# Patient Record
Sex: Female | Born: 1970 | Race: Black or African American | Hispanic: No | Marital: Married | Smoking: Never smoker
Health system: Southern US, Community
[De-identification: ages and names within clinical notes are randomized; demographics above are authoritative.]

## PROBLEM LIST (undated history)

## (undated) DIAGNOSIS — N390 Urinary tract infection, site not specified: Secondary | ICD-10-CM

## (undated) DIAGNOSIS — N870 Mild cervical dysplasia: Secondary | ICD-10-CM

## (undated) DIAGNOSIS — G43909 Migraine, unspecified, not intractable, without status migrainosus: Secondary | ICD-10-CM

## (undated) DIAGNOSIS — T7840XA Allergy, unspecified, initial encounter: Secondary | ICD-10-CM

## (undated) DIAGNOSIS — B977 Papillomavirus as the cause of diseases classified elsewhere: Secondary | ICD-10-CM

## (undated) DIAGNOSIS — R896 Abnormal cytological findings in specimens from other organs, systems and tissues: Secondary | ICD-10-CM

## (undated) DIAGNOSIS — Z8669 Personal history of other diseases of the nervous system and sense organs: Secondary | ICD-10-CM

## (undated) DIAGNOSIS — A749 Chlamydial infection, unspecified: Secondary | ICD-10-CM

## (undated) DIAGNOSIS — B019 Varicella without complication: Secondary | ICD-10-CM

## (undated) DIAGNOSIS — G4700 Insomnia, unspecified: Secondary | ICD-10-CM

## (undated) DIAGNOSIS — IMO0002 Reserved for concepts with insufficient information to code with codable children: Secondary | ICD-10-CM

## (undated) HISTORY — DX: Personal history of other diseases of the nervous system and sense organs: Z86.69

## (undated) HISTORY — DX: Chlamydial infection, unspecified: A74.9

## (undated) HISTORY — DX: Papillomavirus as the cause of diseases classified elsewhere: B97.7

## (undated) HISTORY — DX: Allergy, unspecified, initial encounter: T78.40XA

## (undated) HISTORY — DX: Urinary tract infection, site not specified: N39.0

## (undated) HISTORY — DX: Varicella without complication: B01.9

## (undated) HISTORY — DX: Mild cervical dysplasia: N87.0

## (undated) HISTORY — DX: Reserved for concepts with insufficient information to code with codable children: IMO0002

## (undated) HISTORY — DX: Abnormal cytological findings in specimens from other organs, systems and tissues: R89.6

## (undated) HISTORY — DX: Insomnia, unspecified: G47.00

## (undated) HISTORY — DX: Migraine, unspecified, not intractable, without status migrainosus: G43.909

---

## 1998-02-20 DIAGNOSIS — A749 Chlamydial infection, unspecified: Secondary | ICD-10-CM

## 1998-02-20 HISTORY — DX: Chlamydial infection, unspecified: A74.9

## 1998-09-23 ENCOUNTER — Inpatient Hospital Stay (HOSPITAL_COMMUNITY): Admission: AD | Admit: 1998-09-23 | Discharge: 1998-09-29 | Payer: Self-pay | Admitting: Obstetrics and Gynecology

## 1998-09-25 ENCOUNTER — Encounter: Payer: Self-pay | Admitting: Obstetrics & Gynecology

## 1998-09-27 ENCOUNTER — Encounter: Payer: Self-pay | Admitting: Obstetrics & Gynecology

## 1998-09-29 ENCOUNTER — Encounter: Payer: Self-pay | Admitting: Obstetrics & Gynecology

## 1998-10-06 ENCOUNTER — Inpatient Hospital Stay (HOSPITAL_COMMUNITY): Admission: AD | Admit: 1998-10-06 | Discharge: 1998-10-09 | Payer: Self-pay | Admitting: Obstetrics and Gynecology

## 1998-10-06 ENCOUNTER — Encounter (INDEPENDENT_AMBULATORY_CARE_PROVIDER_SITE_OTHER): Payer: Self-pay | Admitting: Specialist

## 1998-10-22 DIAGNOSIS — R87619 Unspecified abnormal cytological findings in specimens from cervix uteri: Secondary | ICD-10-CM

## 1998-10-22 DIAGNOSIS — IMO0002 Reserved for concepts with insufficient information to code with codable children: Secondary | ICD-10-CM

## 1998-10-22 HISTORY — DX: Unspecified abnormal cytological findings in specimens from cervix uteri: R87.619

## 1998-10-22 HISTORY — DX: Reserved for concepts with insufficient information to code with codable children: IMO0002

## 1998-11-08 ENCOUNTER — Other Ambulatory Visit: Admission: RE | Admit: 1998-11-08 | Discharge: 1998-11-08 | Payer: Self-pay | Admitting: Obstetrics and Gynecology

## 1998-12-14 ENCOUNTER — Encounter (INDEPENDENT_AMBULATORY_CARE_PROVIDER_SITE_OTHER): Payer: Self-pay

## 1998-12-14 ENCOUNTER — Other Ambulatory Visit: Admission: RE | Admit: 1998-12-14 | Discharge: 1998-12-14 | Payer: Self-pay | Admitting: Obstetrics and Gynecology

## 1999-02-21 DIAGNOSIS — B977 Papillomavirus as the cause of diseases classified elsewhere: Secondary | ICD-10-CM

## 1999-02-21 HISTORY — DX: Papillomavirus as the cause of diseases classified elsewhere: B97.7

## 1999-12-12 ENCOUNTER — Other Ambulatory Visit: Admission: RE | Admit: 1999-12-12 | Discharge: 1999-12-12 | Payer: Self-pay | Admitting: Obstetrics and Gynecology

## 2000-12-21 ENCOUNTER — Other Ambulatory Visit: Admission: RE | Admit: 2000-12-21 | Discharge: 2000-12-21 | Payer: Self-pay | Admitting: Obstetrics and Gynecology

## 2001-12-19 ENCOUNTER — Other Ambulatory Visit: Admission: RE | Admit: 2001-12-19 | Discharge: 2001-12-19 | Payer: Self-pay | Admitting: Obstetrics and Gynecology

## 2002-06-27 ENCOUNTER — Other Ambulatory Visit: Admission: RE | Admit: 2002-06-27 | Discharge: 2002-06-27 | Payer: Self-pay | Admitting: Obstetrics and Gynecology

## 2002-12-26 ENCOUNTER — Other Ambulatory Visit: Admission: RE | Admit: 2002-12-26 | Discharge: 2002-12-26 | Payer: Self-pay | Admitting: Obstetrics and Gynecology

## 2003-06-05 ENCOUNTER — Other Ambulatory Visit: Admission: RE | Admit: 2003-06-05 | Discharge: 2003-06-05 | Payer: Self-pay | Admitting: Obstetrics and Gynecology

## 2004-01-07 ENCOUNTER — Other Ambulatory Visit: Admission: RE | Admit: 2004-01-07 | Discharge: 2004-01-07 | Payer: Self-pay | Admitting: Obstetrics and Gynecology

## 2004-02-21 DIAGNOSIS — IMO0001 Reserved for inherently not codable concepts without codable children: Secondary | ICD-10-CM

## 2004-02-21 HISTORY — DX: Reserved for inherently not codable concepts without codable children: IMO0001

## 2004-07-13 ENCOUNTER — Other Ambulatory Visit: Admission: RE | Admit: 2004-07-13 | Discharge: 2004-07-13 | Payer: Self-pay | Admitting: Obstetrics and Gynecology

## 2004-11-30 ENCOUNTER — Other Ambulatory Visit: Admission: RE | Admit: 2004-11-30 | Discharge: 2004-11-30 | Payer: Self-pay | Admitting: Obstetrics and Gynecology

## 2005-02-20 DIAGNOSIS — G4700 Insomnia, unspecified: Secondary | ICD-10-CM

## 2005-02-20 HISTORY — DX: Insomnia, unspecified: G47.00

## 2005-04-20 ENCOUNTER — Other Ambulatory Visit: Admission: RE | Admit: 2005-04-20 | Discharge: 2005-04-20 | Payer: Self-pay | Admitting: Obstetrics and Gynecology

## 2005-10-30 ENCOUNTER — Other Ambulatory Visit: Admission: RE | Admit: 2005-10-30 | Discharge: 2005-10-30 | Payer: Self-pay | Admitting: Obstetrics and Gynecology

## 2006-12-25 ENCOUNTER — Ambulatory Visit (HOSPITAL_COMMUNITY): Admission: RE | Admit: 2006-12-25 | Discharge: 2006-12-25 | Payer: Self-pay | Admitting: Internal Medicine

## 2009-02-20 DIAGNOSIS — N870 Mild cervical dysplasia: Secondary | ICD-10-CM

## 2009-02-20 HISTORY — DX: Mild cervical dysplasia: N87.0

## 2009-04-08 ENCOUNTER — Ambulatory Visit (HOSPITAL_COMMUNITY): Admission: RE | Admit: 2009-04-08 | Discharge: 2009-04-08 | Payer: Self-pay | Admitting: Internal Medicine

## 2009-07-22 ENCOUNTER — Ambulatory Visit (HOSPITAL_COMMUNITY): Admission: RE | Admit: 2009-07-22 | Discharge: 2009-07-22 | Payer: Self-pay | Admitting: Internal Medicine

## 2009-08-19 ENCOUNTER — Encounter: Admission: RE | Admit: 2009-08-19 | Discharge: 2009-08-19 | Payer: Self-pay | Admitting: Obstetrics and Gynecology

## 2010-07-14 ENCOUNTER — Other Ambulatory Visit: Payer: Self-pay | Admitting: Obstetrics and Gynecology

## 2010-07-14 DIAGNOSIS — Z1231 Encounter for screening mammogram for malignant neoplasm of breast: Secondary | ICD-10-CM

## 2010-08-23 ENCOUNTER — Ambulatory Visit
Admission: RE | Admit: 2010-08-23 | Discharge: 2010-08-23 | Disposition: A | Discharge status: Home / Self Care | Payer: BC Managed Care – PPO | Source: Ambulatory Visit | Attending: Obstetrics and Gynecology | Admitting: Obstetrics and Gynecology

## 2010-08-23 DIAGNOSIS — Z1231 Encounter for screening mammogram for malignant neoplasm of breast: Secondary | ICD-10-CM

## 2011-02-16 ENCOUNTER — Encounter: Payer: Self-pay | Admitting: Family Medicine

## 2011-02-16 ENCOUNTER — Ambulatory Visit (INDEPENDENT_AMBULATORY_CARE_PROVIDER_SITE_OTHER): Payer: BC Managed Care – PPO | Admitting: Family Medicine

## 2011-02-16 DIAGNOSIS — G589 Mononeuropathy, unspecified: Secondary | ICD-10-CM | POA: Insufficient documentation

## 2011-02-16 DIAGNOSIS — M545 Low back pain, unspecified: Secondary | ICD-10-CM

## 2011-02-16 DIAGNOSIS — G4700 Insomnia, unspecified: Secondary | ICD-10-CM | POA: Insufficient documentation

## 2011-02-16 MED ORDER — TEMAZEPAM 15 MG PO CAPS: 15.0000 mg | ORAL_CAPSULE | Freq: Every evening | ORAL | Status: DC | PRN

## 2011-02-16 NOTE — Assessment & Plan Note (Signed)
Chronic problem.  Currently asymptomatic.  Will take flexeril and naproxen prn for flares.  Will continue to monitor.

## 2011-02-16 NOTE — Patient Instructions (Signed)
Schedule your complete physical for June- do not eat before this Someone will call you with your sleep appt Start the Restoril nightly for sleep Call with any questions or concerns Welcome!  We're glad to have you! Happy New Year!

## 2011-02-16 NOTE — Progress Notes (Signed)
  Subjective:    Patient ID: Evelyn Murray, female    DOB: 1970-07-30, 40 y.o.   MRN: [id number]  HPI New to establish.  Previous MD- Elisabeth Most.  GYN- Rivard.  UTD on pap and mammo.  Had CPE in June 2012.  Pinched nerve- R upper back, per pt report.  Currently asymptomatic.  Will flare intermittently.  Has seen [facility] previously.  Chronic low back pain- 'they always tell me it's musculoskeletal'.  Will take flexeril as needed, also Aleve as needed.  Currently not painful.  Insomnia- x2 weeks.  Will go to bed at 10 and up at 12.  Recurring problem for a few years.  Has done Ambien w/out relief.  Most recent script is trazodone.  Still waking on Trazodone- 'i'll be groggy but awake'.  Able to fall asleep w/out difficulty but unable to maintain sleep.  Husband denies snoring.  Pt denies daytime sxs of depression or anxiety.  Review of Systems For ROS see HPI     Objective:   Physical Exam  Vitals reviewed. Constitutional: She appears well-developed and well-nourished. No distress.  HENT:  Head: Normocephalic and atraumatic.  Neck: Normal range of motion. Neck supple. No thyromegaly present.  Cardiovascular: Normal rate, regular rhythm, normal heart sounds and intact distal pulses.   Pulmonary/Chest: Effort normal and breath sounds normal. No respiratory distress. She has no wheezes. She has no rales.  Musculoskeletal: Normal range of motion. She exhibits no edema and no tenderness.  Lymphadenopathy:    She has no cervical adenopathy.  Skin: Skin is warm and dry.  Psychiatric: She has a normal mood and affect. Her behavior is normal. Judgment and thought content normal.          Assessment & Plan:

## 2011-02-16 NOTE — Assessment & Plan Note (Signed)
Pt has seen [facility] for this previously.  Currently asymptomatic.  Will continue to follow.

## 2011-02-16 NOTE — Assessment & Plan Note (Signed)
Chronic problem for pt.  Has tried multiple meds which she feels work initially but over time, lose their effect.  Has never seen sleep specialist.  Denies daytime sxs of depression or anxiety.  No trouble falling asleep- difficulty maintaining sleep.  Has never tried Restoril- will start med and refer to sleep specialist.  Pt expressed understanding and is in agreement w/ plan.

## 2011-02-17 ENCOUNTER — Ambulatory Visit: Payer: BC Managed Care – PPO | Admitting: Family Medicine

## 2011-03-21 ENCOUNTER — Institutional Professional Consult (permissible substitution): Payer: BC Managed Care – PPO | Admitting: Pulmonary Disease

## 2011-07-06 ENCOUNTER — Encounter: Payer: Self-pay | Admitting: Family Medicine

## 2011-07-06 ENCOUNTER — Ambulatory Visit (INDEPENDENT_AMBULATORY_CARE_PROVIDER_SITE_OTHER): Payer: BC Managed Care – PPO | Admitting: Family Medicine

## 2011-07-06 VITALS — BP 114/68 | HR 93 | Temp 98.3°F | Wt 180.8 lb

## 2011-07-06 DIAGNOSIS — R059 Cough, unspecified: Secondary | ICD-10-CM

## 2011-07-06 DIAGNOSIS — J069 Acute upper respiratory infection, unspecified: Secondary | ICD-10-CM

## 2011-07-06 DIAGNOSIS — R05 Cough: Secondary | ICD-10-CM

## 2011-07-06 DIAGNOSIS — J329 Chronic sinusitis, unspecified: Secondary | ICD-10-CM

## 2011-07-06 MED ORDER — GUAIFENESIN-CODEINE 100-10 MG/5ML PO SYRP: ORAL_SOLUTION | ORAL | Status: DC

## 2011-07-06 MED ORDER — CEFUROXIME AXETIL 500 MG PO TABS: 500.0000 mg | ORAL_TABLET | Freq: Two times a day (BID) | ORAL | Status: AC

## 2011-07-06 MED ORDER — METHYLPREDNISOLONE ACETATE 80 MG/ML IJ SUSP
80.0000 mg | Freq: Once | INTRAMUSCULAR | Status: AC
  Administered 2011-07-06: 80 mg via INTRAMUSCULAR

## 2011-07-06 MED ORDER — PREDNISONE 10 MG PO TABS: ORAL_TABLET | ORAL | Status: DC

## 2011-07-06 NOTE — Progress Notes (Signed)
Addended by: Arnette Norris on: 07/06/2011 03:26 PM   Modules accepted: Orders

## 2011-07-06 NOTE — Progress Notes (Signed)
  Subjective:     Evelyn Murray is a 41 y.o. female who presents for evaluation of possible sinusitis. Symptoms include bilateral ear pressure/pain, congestion, fever 101 this past Monday, nasal congestion, productive cough with  green colored sputum, purulent nasal discharge and sinus pressure. Onset of symptoms was 3 weeks ago, and has been gradually worsening since that time. Treatment to date: antihistamines, cough suppressants, decongestants and nasal steroids. Pt states she gets sick about every 3-4 months and it turns into sinus infections.  The following portions of the patient's history were reviewed and updated as appropriate: allergies, current medications, past family history, past medical history, past social history, past surgical history and problem list.  Review of Systems Pertinent items are noted in HPI.   Objective:    BP 114/68  Pulse 93  Temp(Src) 98.3 F (36.8 C) (Oral)  Wt 180 lb 12.8 oz (82.01 kg)  SpO2 97% General appearance: alert, cooperative, appears stated age and mild distress Ears: tm dull b/l Nose: green discharge, severe congestion, turbinates red, swollen, sinus tenderness bilateral Throat: lips, mucosa, and tongue normal; teeth and gums normal Neck: mild anterior cervical adenopathy, supple, symmetrical, trachea midline and thyroid not enlarged, symmetric, no tenderness/mass/nodules Lungs: clear to auscultation bilaterally Lymph nodes: Cervical adenopathy: + adenopathy   Assessment:    sinusitis   Plan:    Discussed the diagnosis and treatment of sinusitis. Suggested symptomatic OTC remedies. Nasal saline spray for congestion. Ceftin per orders. Nasal steroids per orders. Follow up as needed. Call in a few days if symptoms aren't resolving.  pred taper and depo medrol Allergy referral

## 2011-07-06 NOTE — Patient Instructions (Signed)

## 2011-07-28 ENCOUNTER — Other Ambulatory Visit: Payer: Self-pay | Admitting: Obstetrics and Gynecology

## 2011-07-28 DIAGNOSIS — Z1231 Encounter for screening mammogram for malignant neoplasm of breast: Secondary | ICD-10-CM

## 2011-08-17 ENCOUNTER — Ambulatory Visit (INDEPENDENT_AMBULATORY_CARE_PROVIDER_SITE_OTHER): Payer: BC Managed Care – PPO | Admitting: Family Medicine

## 2011-08-17 ENCOUNTER — Encounter: Payer: Self-pay | Admitting: Family Medicine

## 2011-08-17 VITALS — BP 118/80 | HR 66 | Temp 98.3°F | Ht 70.5 in | Wt 177.2 lb

## 2011-08-17 DIAGNOSIS — Z0000 Encounter for general adult medical examination without abnormal findings: Secondary | ICD-10-CM | POA: Insufficient documentation

## 2011-08-17 DIAGNOSIS — J45909 Unspecified asthma, uncomplicated: Secondary | ICD-10-CM

## 2011-08-17 LAB — CBC WITH DIFFERENTIAL/PLATELET
Eosinophils Absolute: 0.1 10*3/uL (ref 0.0–0.7)
Eosinophils Relative: 1.1 % (ref 0.0–5.0)
HCT: 38.5 % (ref 36.0–46.0)
Hemoglobin: 12.9 g/dL (ref 12.0–15.0)
Lymphocytes Relative: 21.8 % (ref 12.0–46.0)
Lymphs Abs: 1.7 10*3/uL (ref 0.7–4.0)
Monocytes Absolute: 0.5 10*3/uL (ref 0.1–1.0)
Monocytes Relative: 6.6 % (ref 3.0–12.0)
Neutro Abs: 5.6 10*3/uL (ref 1.4–7.7)
Platelets: 204 10*3/uL (ref 150.0–400.0)
RBC: 3.96 Mil/uL (ref 3.87–5.11)

## 2011-08-17 LAB — BASIC METABOLIC PANEL
BUN: 8 mg/dL (ref 6–23)
CO2: 26 mEq/L (ref 19–32)
Calcium: 8.9 mg/dL (ref 8.4–10.5)
Chloride: 108 mEq/L (ref 96–112)
Creatinine, Ser: 0.9 mg/dL (ref 0.4–1.2)
Glucose, Bld: 75 mg/dL (ref 70–99)

## 2011-08-17 LAB — HEPATIC FUNCTION PANEL
ALT: 23 U/L (ref 0–35)
AST: 19 U/L (ref 0–37)
Albumin: 3.5 g/dL (ref 3.5–5.2)
Alkaline Phosphatase: 35 U/L — ABNORMAL LOW (ref 39–117)
Total Bilirubin: 0.5 mg/dL (ref 0.3–1.2)
Total Protein: 7.1 g/dL (ref 6.0–8.3)

## 2011-08-17 LAB — LIPID PANEL: VLDL: 8.2 mg/dL (ref 0.0–40.0)

## 2011-08-17 NOTE — Progress Notes (Signed)
  Subjective:    Patient ID: Evelyn Murray, female    DOB: 1970/07/11, 41 y.o.   MRN: [id number]  HPI CPE- UTD on GYN.   Review of Systems Patient reports no vision/ hearing changes, adenopathy,fever, weight change,  persistant/recurrent hoarseness , swallowing issues, chest pain, palpitations, edema, persistant/recurrent cough, hemoptysis, dyspnea (rest/exertional/paroxysmal nocturnal), gastrointestinal bleeding (melena, rectal bleeding), abdominal pain, significant heartburn, bowel changes, GU symptoms (dysuria, hematuria, incontinence), Gyn symptoms (abnormal  bleeding, pain),  syncope, focal weakness, memory loss, numbness & tingling, skin/hair/nail changes, abnormal bruising or bleeding, anxiety, or depression.     Objective:   Physical Exam General Appearance:    Alert, cooperative, no distress, appears stated age  Head:    Normocephalic, without obvious abnormality, atraumatic  Eyes:    PERRL, conjunctiva/corneas clear, EOM's intact, fundi    benign, both eyes  Ears:    Normal TM's and external ear canals, both ears  Nose:   Nares normal, septum midline, mucosa normal, no drainage    or sinus tenderness  Throat:   Lips, mucosa, and tongue normal; teeth and gums normal  Neck:   Supple, symmetrical, trachea midline, no adenopathy;    Thyroid: no enlargement/tenderness/nodules  Back:     Symmetric, no curvature, ROM normal, no CVA tenderness  Lungs:     Clear to auscultation bilaterally, respirations unlabored  Chest Wall:    No tenderness or deformity   Heart:    Regular rate and rhythm, S1 and S2 normal, no murmur, rub   or gallop  Breast Exam:    Deferred to GYN  Abdomen:     Soft, non-tender, bowel sounds active all four quadrants,    no masses, no organomegaly  Genitalia:    Deferred to GYN  Rectal:    Extremities:   Extremities normal, atraumatic, no cyanosis or edema  Pulses:   2+ and symmetric all extremities  Skin:   Skin color, texture, turgor normal, no rashes or  lesions  Lymph nodes:   Cervical, supraclavicular, and axillary nodes normal  Neurologic:   CNII-XII intact, normal strength, sensation and reflexes    throughout          Assessment & Plan:

## 2011-08-17 NOTE — Patient Instructions (Addendum)
We'll notify you of your lab results and make any changes if needed Your ideal weight should be 165-175.  Don't stress, you look great! Ask the dentist about your tongue b/c it looks fine Use the Albuterol when you have chest tightness and see if symptoms improve.  If you are routinely using Albuterol more than 1-2x/day, call me and we'll start a controller inhaler. Continue the Zyrtec, Singulair, and Flonase Call with any questions or concerns Happy Early Birthday!!!

## 2011-08-18 ENCOUNTER — Encounter: Payer: Self-pay | Admitting: *Deleted

## 2011-08-20 DIAGNOSIS — J45909 Unspecified asthma, uncomplicated: Secondary | ICD-10-CM | POA: Insufficient documentation

## 2011-08-20 NOTE — Assessment & Plan Note (Signed)
New.  Pt started on med regimen by allergist but pt was confused by this and wasn't sure what to take and when to take it.  Clarified regimen (see instructions).  Pt not interested in returning to this particular doctor.  Told her that I could assume prescribing role and adjust meds prn.

## 2011-08-20 NOTE — Assessment & Plan Note (Signed)
Pt's PE WNL.  UTD on GYN.  Check labs.  Anticipatory guidance provided.  

## 2011-08-21 ENCOUNTER — Encounter: Payer: Self-pay | Admitting: *Deleted

## 2011-08-21 LAB — VITAMIN D 1,25 DIHYDROXY
Vitamin D 1, 25 (OH)2 Total: 72 pg/mL (ref 18–72)
Vitamin D2 1, 25 (OH)2: <8 pg/mL
Vitamin D3 1, 25 (OH)2: 72 pg/mL

## 2011-08-28 ENCOUNTER — Ambulatory Visit (HOSPITAL_BASED_OUTPATIENT_CLINIC_OR_DEPARTMENT_OTHER)
Admission: RE | Admit: 2011-08-28 | Discharge: 2011-08-28 | Disposition: A | Discharge status: Home / Self Care | Payer: BC Managed Care – PPO | Source: Ambulatory Visit | Attending: Obstetrics and Gynecology | Admitting: Obstetrics and Gynecology

## 2011-08-28 DIAGNOSIS — Z1231 Encounter for screening mammogram for malignant neoplasm of breast: Secondary | ICD-10-CM

## 2011-09-06 ENCOUNTER — Encounter: Payer: Self-pay | Admitting: Obstetrics and Gynecology

## 2011-09-06 ENCOUNTER — Ambulatory Visit (INDEPENDENT_AMBULATORY_CARE_PROVIDER_SITE_OTHER): Payer: BC Managed Care – PPO | Admitting: Obstetrics and Gynecology

## 2011-09-06 VITALS — BP 112/74 | Ht 70.0 in | Wt 183.0 lb

## 2011-09-06 DIAGNOSIS — Z01419 Encounter for gynecological examination (general) (routine) without abnormal findings: Secondary | ICD-10-CM

## 2011-09-06 DIAGNOSIS — Z124 Encounter for screening for malignant neoplasm of cervix: Secondary | ICD-10-CM

## 2011-09-06 DIAGNOSIS — N870 Mild cervical dysplasia: Secondary | ICD-10-CM

## 2011-09-06 MED ORDER — FOLIC ACID 1 MG PO TABS: 1.0000 mg | ORAL_TABLET | Freq: Every day | ORAL | Status: DC

## 2011-09-06 NOTE — Progress Notes (Signed)
The patient reports:normal menses, no abnormal bleeding, pelvic pain or discharge  Contraception:oral contraceptives (estrogen/progesterone)jolessa   Last mammogram: was normal July,2013 Last pap: was abnormal: LSIL/CIn1  December  2012  GC/Chlamydia cultures offered: declined HIV/RPR/HbsAg offered:  declined HSV 1 and 2 glycoprotein offered: declined  Menstrual cycle regular and monthly: Yes Menstrual flow normal: Yes  Urinary symptoms: none Normal bowel movements: Yes Reports abuse at home: No  Subjective:    Evelyn Murray is a 41 y.o. female, G1P0101, who presents for an annual exam. Evelyn Murray user, satisfied. 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    History   Social History  . Marital Status: Married    Spouse Name: N/A    Number of Children: N/A  . Years of Education: N/A   Social History Main Topics  . Smoking status: Never Smoker   . Smokeless tobacco: Never Used  . Alcohol Use: No  . Drug Use: No  . Sexually Active: None   Other Topics Concern  . None   Social History Narrative  . None    Menstrual cycle:   LMP: Patient's last menstrual period was 06/04/2011.           Cycle: normal  The following portions of the patient's history were reviewed and updated as appropriate: allergies, current medications, past family history, past medical history, past social history, past surgical history and problem list.  Review of Systems Pertinent items are noted in HPI. Breast:Negative for breast lump,nipple discharge or nipple retraction Gastrointestinal: Negative for abdominal pain, change in bowel habits or rectal bleeding Urinary:negative   Objective:    BP 112/74  Ht 5\' 10"  (1.778 m)  Wt 183 lb (83.008 kg)  BMI 26.26 kg/m2  LMP 06/04/2011    Weight:  Wt Readings from Last 1 Encounters:  09/06/11 183 lb (83.008 kg)          BMI: Body mass index is 26.26 kg/(m^2).  General Appearance: Alert, appropriate appearance for age. No acute  distress HEENT: Grossly normal Neck / Thyroid: Supple, no masses, nodes or enlargement Lungs: clear to auscultation bilaterally Back: No CVA tenderness Breast Exam: No masses or nodes.No dimpling, nipple retraction or discharge. Cardiovascular: Regular rate and rhythm. S1, S2, no murmur Gastrointestinal: Soft, non-tender, no masses or organomegaly Pelvic Exam: Vulva and vagina appear normal. Bimanual exam reveals normal uterus and adnexa. Rectovaginal: normal rectal, no masses Lymphatic Exam: Non-palpable nodes in neck, clavicular, axillary, or inguinal regions Skin: no rash or abnormalities Neurologic: Normal gait and speech, no tremor  Psychiatric: Alert and oriented, appropriate affect.   Wet Prep:not applicable Urinalysis:not applicable UPT: Not done   Assessment:    Normal gyn exam    Plan:    mammogram pap smear: if CIN 1 will repeat Pap in 6 months return annually or prn STD screening: declined Contraception:oral contraceptives (estrogen/progesterone) Limmie Patricia A MD

## 2011-09-12 ENCOUNTER — Encounter: Payer: Self-pay | Admitting: Obstetrics and Gynecology

## 2011-09-12 NOTE — Progress Notes (Signed)
Quick Note:  LGSIL letter with repeat Pap in 6 months  ______

## 2011-12-15 ENCOUNTER — Other Ambulatory Visit: Payer: Self-pay

## 2011-12-15 MED ORDER — CYCLOBENZAPRINE HCL 10 MG PO TABS: 10.0000 mg | ORAL_TABLET | Freq: Three times a day (TID) | ORAL | Status: DC | PRN

## 2011-12-15 NOTE — Telephone Encounter (Signed)
Pt states old doctor Rx this med but it has ran out.  OV 09/06/11   Plz advise     MW

## 2012-03-04 ENCOUNTER — Telehealth: Payer: Self-pay

## 2012-03-04 NOTE — Telephone Encounter (Signed)
Rx refill request [facility], [place] for Seasonale tablets 91's  From last visit pt is currently taking Jolessa and was given rx with 11 refills.  Darien Ramus, CMA

## 2012-03-06 ENCOUNTER — Telehealth: Payer: Self-pay

## 2012-03-06 MED ORDER — LEVONORGEST-ETH ESTRAD 91-DAY 0.15-0.03 MG PO TABS: 1.0000 | ORAL_TABLET | Freq: Every day | ORAL | Status: DC

## 2012-03-06 NOTE — Telephone Encounter (Signed)
VM from pt needing rx refill Marcille Blanco, will send to pharmacy pt voiced understanding  Darien Ramus, CMA

## 2012-03-28 ENCOUNTER — Encounter: Payer: Self-pay | Admitting: Obstetrics and Gynecology

## 2012-03-28 ENCOUNTER — Ambulatory Visit: Payer: BC Managed Care – PPO | Admitting: Obstetrics and Gynecology

## 2012-03-28 DIAGNOSIS — R109 Unspecified abdominal pain: Secondary | ICD-10-CM

## 2012-03-28 NOTE — Progress Notes (Deleted)
Subjective:    Evelyn Murray is a 42 y.o. female, G1P0101, who presents for repeat pap for low grade squamous cells on 09/06/2011.  The following portions of the patient's history were reviewed and updated as appropriate: allergies, current medications, past family history.  Review of Systems Pertinent items are noted in HPI. Breast:Negative for breast lump,nipple discharge or nipple retraction Gastrointestinal: Negative for abdominal pain, change in bowel habits or rectal bleeding Urinary:negative   Objective:    There were no vitals taken for this visit.    Weight:  Wt Readings from Last 1 Encounters:  09/06/11 183 lb (83.008 kg)          BMI: There is no height or weight on file to calculate BMI.  General Appearance: Alert, appropriate appearance for age. No acute distress GYN exam:   Assessment:    {diagnoses; exam gyn:13148}    Plan:    {gyn plan:315269::"mammogram","pap smear","return annually or prn"}  Silverio Lay MD

## 2012-03-28 NOTE — Progress Notes (Signed)
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 female who presents for a repeat pap.  Pt having lower abdominal pain and cramping. Intermittent. Feels like menstrual cramps. Lasting hours. Taking ibuprofen and using heating pad, with some relief but not completely. Pain scale 6/10. Pt is taking continuous BC x 6 months. Pt had breakthrough 3 weeks ago. Which lasted 10 days, heavy for 3 days. 3 cm clots. No associated symptoms. No triggers.   Objective:   General Appearance: Alert, appropriate appearance for age. No acute distress HEENT: Grossly normal Neck / Thyroid: Supple, no masses, nodes or enlargement Lungs: clear to auscultation bilaterally Back: No CVA tenderness Breast Exam: n/a Cardiovascular: Regular rate and rhythm. S1, S2, no murmur Gastrointestinal: Soft, non-tender, no masses or organomegaly Pelvic Exam: Vulva and vagina appear normal. Bimanual exam reveals normal uterus and adnexa. 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

## 2012-03-29 LAB — PAP IG W/ RFLX HPV ASCU

## 2012-04-15 ENCOUNTER — Other Ambulatory Visit: Payer: Self-pay | Admitting: Obstetrics and Gynecology

## 2012-04-15 DIAGNOSIS — R109 Unspecified abdominal pain: Secondary | ICD-10-CM

## 2012-04-17 ENCOUNTER — Ambulatory Visit: Payer: BC Managed Care – PPO

## 2012-04-17 ENCOUNTER — Encounter: Payer: Self-pay | Admitting: Obstetrics and Gynecology

## 2012-04-17 ENCOUNTER — Ambulatory Visit: Payer: BC Managed Care – PPO | Admitting: Obstetrics and Gynecology

## 2012-04-17 VITALS — BP 106/58 | Ht 70.0 in | Wt 190.0 lb

## 2012-04-17 DIAGNOSIS — R109 Unspecified abdominal pain: Secondary | ICD-10-CM

## 2012-04-17 DIAGNOSIS — R102 Pelvic and perineal pain: Secondary | ICD-10-CM

## 2012-04-17 LAB — POCT URINALYSIS DIPSTICK
Bilirubin, UA: NEGATIVE
Blood, UA: NEGATIVE
Glucose, UA: NEGATIVE
Ketones, UA: NEGATIVE
Nitrite, UA: NEGATIVE
pH, UA: 8

## 2012-04-17 LAB — POCT URINE PREGNANCY: Preg Test, Ur: NEGATIVE

## 2012-04-17 NOTE — Progress Notes (Deleted)
Subjective:    Evelyn Murray is a 42 y.o. female, G1P0101, who presents for Gyn ultrasound because of RLQ pain.  The following portions of the patient's history were reviewed and updated as appropriate: allergies, current medications, past family history.  Objective:    LMP 03/10/2012    Weight:  Wt Readings from Last 1 Encounters:  03/28/12 190 lb (86.183 kg)          BMI: There is no weight on file to calculate BMI.  ULTRASOUND: Uterus Anteverted    Adnexa Normal    Endometrium 0.430 cm    Free fluid: no    Other findings:  One, fundal midline posterior fibroid noted (subserosal) 1.5 x 1.0 x 1.6 cm Endometrium - thin. There are a few scattered, echogenic foci seen in the fundal cavity. Question, vascular?? Significance.    Assessment:    {diagnoses; exam gyn:13148}    Plan:    ***  Silverio Lay MD

## 2012-04-17 NOTE — Progress Notes (Signed)
Contraception:Jolessa History of STD:  no history of PID, STD's History of ovarian cyst: no History of fibroids: no History of endometriosis:no Previous ultrasound:no  Urinary symptoms: urinary frequency thinks that is from a lot of hydration  Gastro-intestinal symptoms:  Constipation: no     Diarrhea: no     Nausea: no     Vomiting: no     Fever: no Vaginal discharge: no vaginal discharge  Subjective:    Evelyn Murray is a 42 y.o. female, G1P0101, who presents for Gyn ultrasound because of RLQ pain x 3 months that comes and goes. Pt has taken Aleve and Ibuprofen along with heating pad and has helped. Pt has the worst pain at night. Pt has described pain 5>10 at times. .  The following portions of the patient's history were reviewed and updated as appropriate: allergies, current medications, past family history.  Objective:    BP 106/58  Ht 5\' 10"  (1.778 m)  Wt 190 lb (86.183 kg)  BMI 27.26 kg/m2  LMP 03/10/2012    Weight:  Wt Readings from Last 1 Encounters:  04/17/12 190 lb (86.183 kg)          BMI: Body mass index is 27.26 kg/(m^2).  ULTRASOUND: Uterus 3.9x6x5.2 cm    Adnexa normal    Endometrium 4  mm    Free fluid: no    Other findings:  Posterior fundal fibroid 1.5 cm   Assessment:    normal ultrasound    Plan:    Reassurance Follow-up 6 months for pap  Silverio Lay MD

## 2012-07-23 ENCOUNTER — Other Ambulatory Visit: Payer: Self-pay | Admitting: Obstetrics and Gynecology

## 2012-07-23 DIAGNOSIS — Z1231 Encounter for screening mammogram for malignant neoplasm of breast: Secondary | ICD-10-CM

## 2012-08-28 ENCOUNTER — Ambulatory Visit (HOSPITAL_BASED_OUTPATIENT_CLINIC_OR_DEPARTMENT_OTHER)
Admission: RE | Admit: 2012-08-28 | Discharge: 2012-08-28 | Disposition: A | Discharge status: Home / Self Care | Payer: BC Managed Care – PPO | Source: Ambulatory Visit | Attending: Obstetrics and Gynecology | Admitting: Obstetrics and Gynecology

## 2012-08-28 DIAGNOSIS — Z1231 Encounter for screening mammogram for malignant neoplasm of breast: Secondary | ICD-10-CM

## 2012-11-05 ENCOUNTER — Encounter: Payer: Self-pay | Admitting: Family Medicine

## 2012-11-05 ENCOUNTER — Ambulatory Visit (INDEPENDENT_AMBULATORY_CARE_PROVIDER_SITE_OTHER): Payer: BC Managed Care – PPO | Admitting: Family Medicine

## 2012-11-05 VITALS — BP 122/82 | HR 64 | Temp 98.4°F | Ht 70.0 in | Wt 195.4 lb

## 2012-11-05 DIAGNOSIS — Z0000 Encounter for general adult medical examination without abnormal findings: Secondary | ICD-10-CM

## 2012-11-05 DIAGNOSIS — Z23 Encounter for immunization: Secondary | ICD-10-CM

## 2012-11-05 LAB — LIPID PANEL
HDL: 32.7 mg/dL — ABNORMAL LOW (ref >39.00)
Total CHOL/HDL Ratio: 4
VLDL: 13.8 mg/dL (ref 0.0–40.0)

## 2012-11-05 LAB — CBC WITH DIFFERENTIAL/PLATELET
Eosinophils Absolute: 0.2 10*3/uL (ref 0.0–0.7)
MCHC: 34.2 g/dL (ref 30.0–36.0)
MCV: 93.3 fl (ref 78.0–100.0)
Monocytes Absolute: 0.4 10*3/uL (ref 0.1–1.0)
Neutrophils Relative %: 67.2 % (ref 43.0–77.0)
Platelets: 230 10*3/uL (ref 150.0–400.0)

## 2012-11-05 LAB — BASIC METABOLIC PANEL
GFR: 111.29 mL/min (ref >60.00)
Potassium: 4 mEq/L (ref 3.5–5.1)
Sodium: 136 mEq/L (ref 135–145)

## 2012-11-05 LAB — HEPATIC FUNCTION PANEL
AST: 23 U/L (ref 0–37)
Alkaline Phosphatase: 33 U/L — ABNORMAL LOW (ref 39–117)
Total Bilirubin: 0.4 mg/dL (ref 0.3–1.2)

## 2012-11-05 LAB — TSH: TSH: 2.4 u[IU]/mL (ref 0.35–5.50)

## 2012-11-05 MED ORDER — OMEPRAZOLE 20 MG PO CPDR: 20.0000 mg | DELAYED_RELEASE_CAPSULE | Freq: Every day | ORAL | Status: DC

## 2012-11-05 NOTE — Progress Notes (Signed)
  Subjective:    Patient ID: Evelyn Murray, female    DOB: August 01, 1970, 42 y.o.   MRN: [id number]  HPI CPE- UTD on pap and mammo   Review of Systems Patient reports no vision/ hearing changes, adenopathy,fever, weight change,  persistant/recurrent hoarseness , swallowing issues, chest pain, palpitations, edema, persistant/recurrent cough, hemoptysis, dyspnea (rest/exertional/paroxysmal nocturnal), gastrointestinal bleeding (melena, rectal bleeding), abdominal pain, significant heartburn, bowel changes, GU symptoms (dysuria, hematuria, incontinence), Gyn symptoms (abnormal  bleeding, pain),  syncope, focal weakness, memory loss, numbness & tingling, skin/hair/nail changes, abnormal bruising or bleeding, anxiety, or depression.     Objective:   Physical Exam General Appearance:    Alert, cooperative, no distress, appears stated age  Head:    Normocephalic, without obvious abnormality, atraumatic  Eyes:    PERRL, conjunctiva/corneas clear, EOM's intact, fundi    benign, both eyes  Ears:    Normal TM's and external ear canals, both ears  Nose:   Nares normal, septum midline, mucosa normal, no drainage    or sinus tenderness  Throat:   Lips, mucosa, and tongue normal; teeth and gums normal  Neck:   Supple, symmetrical, trachea midline, no adenopathy;    Thyroid: no enlargement/tenderness/nodules  Back:     Symmetric, no curvature, ROM normal, no CVA tenderness  Lungs:     Clear to auscultation bilaterally, respirations unlabored  Chest Wall:    No tenderness or deformity   Heart:    Regular rate and rhythm, S1 and S2 normal, no murmur, rub   or gallop  Breast Exam:    Deferred to GYN  Abdomen:     Soft, non-tender, bowel sounds active all four quadrants,    no masses, no organomegaly  Genitalia:    Deferred to GYN  Rectal:    Extremities:   Extremities normal, atraumatic, no cyanosis or edema  Pulses:   2+ and symmetric all extremities  Skin:   Skin color, texture, turgor normal, no  rashes or lesions  Lymph nodes:   Cervical, supraclavicular, and axillary nodes normal  Neurologic:   CNII-XII intact, normal strength, sensation and reflexes    throughout          Assessment & Plan:

## 2012-11-05 NOTE — Patient Instructions (Addendum)
We'll notify you of your lab results and make any changes if needed Keep up the good work!  You look great! If you continue to have pain- please call your GYN Call with any questions or concerns Happy Fall!!!

## 2012-11-05 NOTE — Assessment & Plan Note (Signed)
Pt's PE WNL.  UTD on GYN.  Check labs.  Anticipatory guidance provided.  

## 2012-11-06 ENCOUNTER — Encounter: Payer: Self-pay | Admitting: General Practice

## 2012-11-06 LAB — POCT URINALYSIS DIPSTICK
Ketones, UA: NEGATIVE
Protein, UA: NEGATIVE
Spec Grav, UA: 1.02

## 2012-11-09 LAB — VITAMIN D 1,25 DIHYDROXY: Vitamin D 1, 25 (OH)2 Total: 92 pg/mL — ABNORMAL HIGH (ref 18–72)

## 2012-11-14 ENCOUNTER — Encounter: Payer: Self-pay | Admitting: General Practice

## 2013-01-20 ENCOUNTER — Other Ambulatory Visit: Payer: Self-pay | Admitting: Family Medicine

## 2013-01-20 NOTE — Telephone Encounter (Signed)
Med filled.  

## 2013-05-07 LAB — HM PAP SMEAR: HM Pap smear: ABNORMAL

## 2013-05-26 ENCOUNTER — Other Ambulatory Visit: Payer: Self-pay | Admitting: Family Medicine

## 2013-05-27 NOTE — Telephone Encounter (Signed)
Med filled.  

## 2013-07-22 ENCOUNTER — Other Ambulatory Visit (HOSPITAL_BASED_OUTPATIENT_CLINIC_OR_DEPARTMENT_OTHER): Payer: Self-pay | Admitting: Obstetrics and Gynecology

## 2013-07-22 DIAGNOSIS — Z1231 Encounter for screening mammogram for malignant neoplasm of breast: Secondary | ICD-10-CM

## 2013-07-28 ENCOUNTER — Ambulatory Visit (INDEPENDENT_AMBULATORY_CARE_PROVIDER_SITE_OTHER): Payer: BC Managed Care – PPO | Admitting: Family Medicine

## 2013-07-28 ENCOUNTER — Encounter: Payer: Self-pay | Admitting: Family Medicine

## 2013-07-28 VITALS — BP 120/84 | HR 114 | Temp 98.2°F | Resp 16 | Wt 210.0 lb

## 2013-07-28 DIAGNOSIS — M6580 Other synovitis and tenosynovitis, unspecified site: Secondary | ICD-10-CM

## 2013-07-28 DIAGNOSIS — M5412 Radiculopathy, cervical region: Secondary | ICD-10-CM

## 2013-07-28 DIAGNOSIS — M5010 Cervical disc disorder with radiculopathy, unspecified cervical region: Secondary | ICD-10-CM | POA: Insufficient documentation

## 2013-07-28 DIAGNOSIS — J329 Chronic sinusitis, unspecified: Secondary | ICD-10-CM

## 2013-07-28 DIAGNOSIS — M76899 Other specified enthesopathies of unspecified lower limb, excluding foot: Secondary | ICD-10-CM

## 2013-07-28 MED ORDER — PREDNISONE 10 MG PO TABS: ORAL_TABLET | ORAL | Status: DC

## 2013-07-28 MED ORDER — CYCLOBENZAPRINE HCL 10 MG PO TABS: 10.0000 mg | ORAL_TABLET | Freq: Three times a day (TID) | ORAL | Status: DC | PRN

## 2013-07-28 NOTE — Patient Instructions (Signed)
Follow up as needed Start the Prednisone as directed- take w/ food Use the Flexeril nightly for spasm- will cause drowsiness We'll call you with your neurosurg referral HEAT! The leg pain is your hip flexor and will improve w/ prednisone Call with any questions or concerns Hang in there!!

## 2013-07-28 NOTE — Progress Notes (Signed)
   Subjective:    Patient ID: Evelyn Murray, female    DOB: 1970/09/30, 43 y.o.   MRN: [id number]  HPI Pain- pain is daily for 2-3 months.  Initially felt numbness in the R arm and thought she had just slept on it wrong.  Now having pain from neck, into arm, down R side of back, and into R leg all the way to ankle.  Has hx of pinched nerve (in upper back) and reports that the numbness she has feels similar but the pain does not.  Some days are worse than others- Saturday was unable to grasp things w/ R arm.  At night will have pain centered in upper R thigh.  Now having pain during the day as well.  R hand dominant.  Walking regularly on treadmill, due to change shoes.   Review of Systems For ROS see HPI     Objective:   Physical Exam  Vitals reviewed. Constitutional: She is oriented to person, place, and time. She appears well-developed and well-nourished. No distress.  Neck:  + R trap spasm  Cardiovascular: Intact distal pulses.   Musculoskeletal:  Limited ROM of R shoulder due to pain and spasm + TTP over R hip flexor No swelling of R shoulder or R hip flexor  Neurological: She is alert and oriented to person, place, and time. She has normal reflexes. No cranial nerve deficit. Coordination normal.  Skin: Skin is warm and dry.  Psychiatric: She has a normal mood and affect. Her behavior is normal. Thought content normal.          Assessment & Plan:

## 2013-07-28 NOTE — Progress Notes (Signed)
Pre visit review using our clinic review tool, if applicable. No additional management support is needed unless otherwise documented below in the visit note. 

## 2013-07-29 NOTE — Assessment & Plan Note (Signed)
New.  Pt's pred taper should improve sxs.  Alternate ice/heat prn.  If no improvement, may need referral to PT for home exercise program.  Will follow.

## 2013-07-29 NOTE — Assessment & Plan Note (Signed)
Recurrent problem for pt.  Start pred taper.  Flexeril prn.  Heat.  If no improvement will need referral to ortho or neurosurg.  Reviewed supportive care and red flags that should prompt return.  Pt expressed understanding and is in agreement w/ plan.

## 2013-08-20 LAB — HM MAMMOGRAPHY: HM Mammogram: NORMAL

## 2013-09-10 ENCOUNTER — Ambulatory Visit (HOSPITAL_BASED_OUTPATIENT_CLINIC_OR_DEPARTMENT_OTHER)
Admission: RE | Admit: 2013-09-10 | Discharge: 2013-09-10 | Disposition: A | Discharge status: Home / Self Care | Payer: BC Managed Care – PPO | Source: Ambulatory Visit | Attending: Obstetrics and Gynecology | Admitting: Obstetrics and Gynecology

## 2013-09-10 DIAGNOSIS — Z1231 Encounter for screening mammogram for malignant neoplasm of breast: Secondary | ICD-10-CM | POA: Insufficient documentation

## 2013-09-30 ENCOUNTER — Telehealth: Payer: Self-pay | Admitting: Family Medicine

## 2013-09-30 MED ORDER — TEMAZEPAM 15 MG PO CAPS: 15.0000 mg | ORAL_CAPSULE | Freq: Every evening | ORAL | Status: DC | PRN

## 2013-09-30 NOTE — Telephone Encounter (Signed)
Ok for #30, 1 refill 

## 2013-09-30 NOTE — Telephone Encounter (Signed)
Last  07-28-13 restoril last filled 02-16-11 #30 with 1  Please advise if ok to fill?

## 2013-09-30 NOTE — Telephone Encounter (Signed)
Med filled.  

## 2013-09-30 NOTE — Telephone Encounter (Signed)
Caller name: Willis ModenaWilson, Cheryal   Relation to pt: Self  Call back number: [phone] [phone]  Reason for call: pt is requesting a refill script Temazepam.

## 2013-10-25 ENCOUNTER — Other Ambulatory Visit: Payer: Self-pay | Admitting: Family Medicine

## 2013-10-28 NOTE — Telephone Encounter (Signed)
Med filled x 6 months.   

## 2013-10-30 ENCOUNTER — Other Ambulatory Visit: Payer: Self-pay | Admitting: Family Medicine

## 2013-10-30 NOTE — Telephone Encounter (Signed)
Med filled.  

## 2013-11-07 ENCOUNTER — Ambulatory Visit (INDEPENDENT_AMBULATORY_CARE_PROVIDER_SITE_OTHER): Payer: BC Managed Care – PPO | Admitting: Family Medicine

## 2013-11-07 ENCOUNTER — Encounter: Payer: Self-pay | Admitting: Family Medicine

## 2013-11-07 ENCOUNTER — Encounter: Payer: Self-pay | Admitting: General Practice

## 2013-11-07 VITALS — BP 130/80 | HR 96 | Temp 98.0°F | Resp 16 | Ht 71.0 in | Wt 212.5 lb

## 2013-11-07 DIAGNOSIS — F419 Anxiety disorder, unspecified: Secondary | ICD-10-CM | POA: Insufficient documentation

## 2013-11-07 DIAGNOSIS — F489 Nonpsychotic mental disorder, unspecified: Secondary | ICD-10-CM

## 2013-11-07 DIAGNOSIS — M5010 Cervical disc disorder with radiculopathy, unspecified cervical region: Secondary | ICD-10-CM

## 2013-11-07 DIAGNOSIS — F411 Generalized anxiety disorder: Secondary | ICD-10-CM

## 2013-11-07 DIAGNOSIS — M5412 Radiculopathy, cervical region: Secondary | ICD-10-CM

## 2013-11-07 DIAGNOSIS — Z0000 Encounter for general adult medical examination without abnormal findings: Secondary | ICD-10-CM

## 2013-11-07 DIAGNOSIS — Z23 Encounter for immunization: Secondary | ICD-10-CM

## 2013-11-07 DIAGNOSIS — F5105 Insomnia due to other mental disorder: Secondary | ICD-10-CM

## 2013-11-07 LAB — HEPATIC FUNCTION PANEL
ALK PHOS: 48 U/L (ref 39–117)
ALT: 42 U/L — ABNORMAL HIGH (ref 0–35)
AST: 31 U/L (ref 0–37)
Albumin: 3.5 g/dL (ref 3.5–5.2)
BILIRUBIN DIRECT: 0 mg/dL (ref 0.0–0.3)
BILIRUBIN TOTAL: 0.3 mg/dL (ref 0.2–1.2)
Total Protein: 7.5 g/dL (ref 6.0–8.3)

## 2013-11-07 LAB — LIPID PANEL
Cholesterol: 115 mg/dL (ref 0–200)
HDL: 26.9 mg/dL — ABNORMAL LOW (ref >39.00)
LDL CALC: 75 mg/dL (ref 0–99)
NONHDL: 88.1
Total CHOL/HDL Ratio: 4
Triglycerides: 67 mg/dL (ref 0.0–149.0)
VLDL: 13.4 mg/dL (ref 0.0–40.0)

## 2013-11-07 LAB — TSH: TSH: 1.97 u[IU]/mL (ref 0.35–4.50)

## 2013-11-07 LAB — BASIC METABOLIC PANEL
BUN: 8 mg/dL (ref 6–23)
CALCIUM: 9.2 mg/dL (ref 8.4–10.5)
CO2: 22 mEq/L (ref 19–32)
Chloride: 105 mEq/L (ref 96–112)
Creatinine, Ser: 0.8 mg/dL (ref 0.4–1.2)
GFR: 98.25 mL/min (ref >60.00)
Glucose, Bld: 84 mg/dL (ref 70–99)
POTASSIUM: 3.9 meq/L (ref 3.5–5.1)
SODIUM: 137 meq/L (ref 135–145)

## 2013-11-07 LAB — CBC WITH DIFFERENTIAL/PLATELET
BASOS PCT: 0.5 % (ref 0.0–3.0)
Basophils Absolute: 0 10*3/uL (ref 0.0–0.1)
EOS ABS: 0.2 10*3/uL (ref 0.0–0.7)
Eosinophils Relative: 2.4 % (ref 0.0–5.0)
HEMATOCRIT: 39 % (ref 36.0–46.0)
HEMOGLOBIN: 13 g/dL (ref 12.0–15.0)
LYMPHS ABS: 1.6 10*3/uL (ref 0.7–4.0)
LYMPHS PCT: 20.3 % (ref 12.0–46.0)
MCHC: 33.4 g/dL (ref 30.0–36.0)
MCV: 94.9 fl (ref 78.0–100.0)
MONO ABS: 0.5 10*3/uL (ref 0.1–1.0)
Monocytes Relative: 6.3 % (ref 3.0–12.0)
NEUTROS ABS: 5.4 10*3/uL (ref 1.4–7.7)
Neutrophils Relative %: 70.5 % (ref 43.0–77.0)
Platelets: 250 10*3/uL (ref 150.0–400.0)
RBC: 4.11 Mil/uL (ref 3.87–5.11)
RDW: 12.9 % (ref 11.5–15.5)
WBC: 7.7 10*3/uL (ref 4.0–10.5)

## 2013-11-07 LAB — VITAMIN D 25 HYDROXY (VIT D DEFICIENCY, FRACTURES): VITD: 71.47 ng/mL (ref 30.00–100.00)

## 2013-11-07 MED ORDER — ESCITALOPRAM OXALATE 10 MG PO TABS: 10.0000 mg | ORAL_TABLET | Freq: Every day | ORAL | Status: DC

## 2013-11-07 NOTE — Progress Notes (Signed)
Pre visit review using our clinic review tool, if applicable. No additional management support is needed unless otherwise documented below in the visit note. 

## 2013-11-07 NOTE — Assessment & Plan Note (Signed)
Pt's PE WNL w/ exception of obesity.  Check labs.  Anticipatory guidance provided.  

## 2013-11-07 NOTE — Assessment & Plan Note (Signed)
New.  Pt admits to high stress levels. Unable to fall asleep at night due to ongoing anxiety.  Will start low dose Lexapro and monitor for symptom improvement.

## 2013-11-07 NOTE — Patient Instructions (Signed)
Follow up in 4-6 weeks to recheck anxiety We'll notify you of your lab results and make any changes if needed Try and make healthy food choices and get regular exercise Start the Lexapro daily for anxiety (which should help the insomnia) We'll call you with your neurosurgery appt for the neck pain Call with any questions or concerns Happy Fall!!!

## 2013-11-07 NOTE — Progress Notes (Signed)
   Subjective:    Patient ID: Evelyn Murray, female    DOB: 1970-04-01, 43 y.o.   MRN: [id number]  HPI CPE- UTD on GYN.    Insomnia- pt reports she is unable to turn her brain off.  Will be up 'half the night' stressing.  Pt reports daytime stress as well.  Using [employer] w/ some intermittent relief.  Back pain- 1 month ago had episode in store where she 'turned the wrong way' and 'i couldn't lift my arm (R)'.  Increased HAs.  HAs will ease w/ heating pad on neck.  Review of Systems Patient reports no vision/ hearing changes, adenopathy,fever, weight change,  persistant/recurrent hoarseness , swallowing issues, chest pain, palpitations, edema, persistant/recurrent cough, hemoptysis, dyspnea (rest/exertional/paroxysmal nocturnal), gastrointestinal bleeding (melena, rectal bleeding), abdominal pain, significant heartburn, bowel changes, GU symptoms (dysuria, hematuria, incontinence), Gyn symptoms (abnormal  bleeding, pain),  syncope, focal weakness, memory loss, numbness & tingling, skin/hair/nail changes, abnormal bruising or bleeding.     Objective:   Physical Exam General Appearance:    Alert, cooperative, no distress, appears stated age, obese  Head:    Normocephalic, without obvious abnormality, atraumatic  Eyes:    PERRL, conjunctiva/corneas clear, EOM's intact, fundi    benign, both eyes  Ears:    Normal TM's and external ear canals, both ears  Nose:   Nares normal, septum midline, mucosa normal, no drainage    or sinus tenderness  Throat:   Lips, mucosa, and tongue normal; teeth and gums normal  Neck:   Supple, symmetrical, trachea midline, no adenopathy;    Thyroid: no enlargement/tenderness/nodules  Back:     Symmetric, no curvature, ROM normal, no CVA tenderness  Lungs:     Clear to auscultation bilaterally, respirations unlabored  Chest Wall:    No tenderness or deformity   Heart:    Regular rate and rhythm, S1 and S2 normal, no murmur, rub   or gallop  Breast Exam:     Deferred to GYN  Abdomen:     Soft, non-tender, bowel sounds active all four quadrants,    no masses, no organomegaly  Genitalia:    Deferred to GYN  Rectal:    Extremities:   Extremities normal, atraumatic, no cyanosis or edema  Pulses:   2+ and symmetric all extremities  Skin:   Skin color, texture, turgor normal, no rashes or lesions  Lymph nodes:   Cervical, supraclavicular, and axillary nodes normal  Neurologic:   CNII-XII intact, normal strength, sensation and reflexes    throughout          Assessment & Plan:

## 2013-11-07 NOTE — Assessment & Plan Note (Signed)
Chronic problem.  Pt is having recurrent pain and each episode is worse than previous.  Now interested in seeing Neurosurg.  Will refer.

## 2013-12-05 ENCOUNTER — Encounter: Payer: Self-pay | Admitting: Family Medicine

## 2013-12-05 ENCOUNTER — Ambulatory Visit (INDEPENDENT_AMBULATORY_CARE_PROVIDER_SITE_OTHER): Payer: BC Managed Care – PPO | Admitting: Family Medicine

## 2013-12-05 VITALS — BP 140/82 | HR 87 | Temp 98.0°F | Resp 16 | Wt 210.0 lb

## 2013-12-05 DIAGNOSIS — J018 Other acute sinusitis: Secondary | ICD-10-CM

## 2013-12-05 DIAGNOSIS — G43909 Migraine, unspecified, not intractable, without status migrainosus: Secondary | ICD-10-CM | POA: Insufficient documentation

## 2013-12-05 DIAGNOSIS — J019 Acute sinusitis, unspecified: Secondary | ICD-10-CM | POA: Insufficient documentation

## 2013-12-05 DIAGNOSIS — G43009 Migraine without aura, not intractable, without status migrainosus: Secondary | ICD-10-CM

## 2013-12-05 MED ORDER — ONDANSETRON HCL 4 MG/2ML IJ SOLN
4.0000 mg | Freq: Once | INTRAMUSCULAR | Status: AC
  Administered 2013-12-05: 4 mg via INTRAMUSCULAR

## 2013-12-05 MED ORDER — ONDANSETRON HCL 4 MG PO TABS: 4.0000 mg | ORAL_TABLET | Freq: Three times a day (TID) | ORAL | Status: DC | PRN

## 2013-12-05 MED ORDER — KETOROLAC TROMETHAMINE 60 MG/2ML IM SOLN
60.0000 mg | Freq: Once | INTRAMUSCULAR | Status: AC
  Administered 2013-12-05: 60 mg via INTRAMUSCULAR

## 2013-12-05 MED ORDER — SUMATRIPTAN SUCCINATE 50 MG PO TABS: 50.0000 mg | ORAL_TABLET | Freq: Once | ORAL | Status: DC

## 2013-12-05 MED ORDER — AMOXICILLIN 875 MG PO TABS: 875.0000 mg | ORAL_TABLET | Freq: Two times a day (BID) | ORAL | Status: DC

## 2013-12-05 NOTE — Progress Notes (Signed)
Pre visit review using our clinic review tool, if applicable. No additional management support is needed unless otherwise documented below in the visit note. 

## 2013-12-05 NOTE — Progress Notes (Signed)
   Subjective:    Patient ID: Evelyn Murray, female    DOB: 1970-02-26, 43 y.o.   MRN: [id number]  Headache   Sinusitis Associated symptoms include headaches.   HA- sxs started Monday w/ 'pulsing' HA, improved w/ Aleve.  Similar sxs on Tuesday.  Wednesday, 'it felt like it was going to explode.  It was off the charts'.  Woke this AM w/ a dull ache and 'it has rebounded w/ a vengeance'.  R temporal HA.  No blurry or double vision.  + nausea.  + photo and phonophobia.  Also sensitive to smells.  Feels similar to previous migraines but pt has not had one recently.  Currently menstruating.  + sinus pain/pressure.   Review of Systems  Neurological: Positive for headaches.   For ROS see HPI     Objective:   Physical Exam  Vitals reviewed. Constitutional: She is oriented to person, place, and time. She appears well-developed and well-nourished. No distress.  HENT:  Head: Normocephalic and atraumatic.  Right Ear: Tympanic membrane normal.  Left Ear: Tympanic membrane normal.  Nose: Mucosal edema and rhinorrhea present. Right sinus exhibits maxillary sinus tenderness and frontal sinus tenderness. Left sinus exhibits maxillary sinus tenderness and frontal sinus tenderness.  Mouth/Throat: Uvula is midline and mucous membranes are normal. Posterior oropharyngeal erythema present. No oropharyngeal exudate.  Eyes: Conjunctivae and EOM are normal. Pupils are equal, round, and reactive to light.  Neck: Normal range of motion. Neck supple.  Cardiovascular: Normal rate, regular rhythm and normal heart sounds.   Pulmonary/Chest: Effort normal and breath sounds normal. No respiratory distress. She has no wheezes.  Musculoskeletal: She exhibits no edema.  Lymphadenopathy:    She has no cervical adenopathy.  Neurological: She is alert and oriented to person, place, and time. She has normal reflexes. No cranial nerve deficit. Coordination normal.  Skin: Skin is warm and dry.  Psychiatric: She has a  normal mood and affect. Her behavior is normal. Thought content normal.          Assessment & Plan:

## 2013-12-05 NOTE — Patient Instructions (Signed)
Cancel next week's appt Take the Imitrex when you get home Use the Zofran as needed for nausea (you got an injection of this today) Do not take any additional ibuprofen, excedrin, aleve, etc b/c of the Toradol injection today Start the Amoxicillin twice daily for the sinus infection Drink plenty of fluids REST! Call with any questions or concerns Hang in there!!!

## 2013-12-07 NOTE — Assessment & Plan Note (Signed)
New.  Pt's sxs and PE consistent w/ infxn.  Start abx.  Reviewed supportive care and red flags that should prompt return.  Pt expressed understanding and is in agreement w/ plan.  

## 2013-12-07 NOTE — Assessment & Plan Note (Signed)
Pt w/ both menstrual migraine and sinusitis.  toradol injection and Zofran given in office.  Refill on Imitrex.  tx underlying sinusitis.  Reviewed supportive care and red flags that should prompt return.  Pt expressed understanding and is in agreement w/ plan.

## 2013-12-11 ENCOUNTER — Ambulatory Visit: Payer: BC Managed Care – PPO | Admitting: Family Medicine

## 2013-12-22 ENCOUNTER — Encounter: Payer: Self-pay | Admitting: Family Medicine

## 2014-01-02 ENCOUNTER — Other Ambulatory Visit: Payer: Self-pay | Admitting: Family Medicine

## 2014-01-02 NOTE — Telephone Encounter (Signed)
Med filled.  

## 2014-04-04 ENCOUNTER — Other Ambulatory Visit: Payer: Self-pay | Admitting: Family Medicine

## 2014-04-06 NOTE — Telephone Encounter (Signed)
Med filled.  

## 2014-04-17 ENCOUNTER — Other Ambulatory Visit (HOSPITAL_COMMUNITY): Payer: Self-pay | Admitting: Obstetrics and Gynecology

## 2014-04-17 DIAGNOSIS — Z308 Encounter for other contraceptive management: Secondary | ICD-10-CM

## 2014-05-11 ENCOUNTER — Other Ambulatory Visit: Payer: Self-pay | Admitting: Family Medicine

## 2014-05-12 NOTE — Telephone Encounter (Signed)
Med filled.  

## 2014-06-30 ENCOUNTER — Ambulatory Visit (HOSPITAL_COMMUNITY)
Admission: RE | Admit: 2014-06-30 | Discharge: 2014-06-30 | Disposition: A | Discharge status: Home / Self Care | Payer: BLUE CROSS/BLUE SHIELD | Source: Ambulatory Visit | Attending: Obstetrics and Gynecology | Admitting: Obstetrics and Gynecology

## 2014-06-30 DIAGNOSIS — Z3049 Encounter for surveillance of other contraceptives: Secondary | ICD-10-CM | POA: Diagnosis not present

## 2014-06-30 DIAGNOSIS — Z308 Encounter for other contraceptive management: Secondary | ICD-10-CM

## 2014-06-30 MED ORDER — IOHEXOL 300 MG/ML  SOLN
30.0000 mL | Freq: Once | INTRAMUSCULAR | Status: AC | PRN
  Administered 2014-06-30: 30 mL

## 2014-06-30 NOTE — Procedures (Signed)
Evelyn Murray is a 44 y.o. female, G1P0101, who is status post Essure sterilization 12 weeks ago without complication.   Procedure with R&B was reviewed and patient was consented.   A speculum was inserted in the vagina and cervix was prepped with Betadine after confirming absence of allergy.  The Hysterogram catheter was inserted without difficulty and its balloon was inflated with air.  The speculum was removed.  We proceeded to obtain all required images to confirm complete bilateral tubal blockage and appropriate placement of both Essure coils.  The catheter was removed.  Images were reviewed with Dr Bradly ChrisStroud  who confirms our findings.   Findings were reviewed with patient who is discharged home in a well and stable condition.   Silverio LaySandra Moe Brier MD

## 2014-07-20 ENCOUNTER — Other Ambulatory Visit: Payer: Self-pay | Admitting: Family Medicine

## 2014-07-21 NOTE — Telephone Encounter (Signed)
Med filled.  

## 2014-08-07 ENCOUNTER — Other Ambulatory Visit (HOSPITAL_BASED_OUTPATIENT_CLINIC_OR_DEPARTMENT_OTHER): Payer: Self-pay | Admitting: Obstetrics and Gynecology

## 2014-08-07 DIAGNOSIS — Z1239 Encounter for other screening for malignant neoplasm of breast: Secondary | ICD-10-CM

## 2014-09-15 ENCOUNTER — Ambulatory Visit (HOSPITAL_BASED_OUTPATIENT_CLINIC_OR_DEPARTMENT_OTHER)
Admission: RE | Admit: 2014-09-15 | Discharge: 2014-09-15 | Disposition: A | Discharge status: Home / Self Care | Payer: BLUE CROSS/BLUE SHIELD | Source: Ambulatory Visit | Attending: Obstetrics and Gynecology | Admitting: Obstetrics and Gynecology

## 2014-09-15 DIAGNOSIS — Z1239 Encounter for other screening for malignant neoplasm of breast: Secondary | ICD-10-CM

## 2014-09-15 DIAGNOSIS — Z1231 Encounter for screening mammogram for malignant neoplasm of breast: Secondary | ICD-10-CM | POA: Insufficient documentation

## 2014-09-15 LAB — HM MAMMOGRAPHY

## 2014-11-02 ENCOUNTER — Encounter: Payer: Self-pay | Admitting: Family Medicine

## 2014-11-02 ENCOUNTER — Ambulatory Visit (INDEPENDENT_AMBULATORY_CARE_PROVIDER_SITE_OTHER): Payer: BLUE CROSS/BLUE SHIELD | Admitting: Family Medicine

## 2014-11-02 VITALS — BP 130/82 | HR 108 | Temp 98.1°F | Resp 17 | Wt 206.1 lb

## 2014-11-02 DIAGNOSIS — J0100 Acute maxillary sinusitis, unspecified: Secondary | ICD-10-CM | POA: Diagnosis not present

## 2014-11-02 MED ORDER — AMOXICILLIN 875 MG PO TABS: 875.0000 mg | ORAL_TABLET | Freq: Two times a day (BID) | ORAL | Status: DC

## 2014-11-02 NOTE — Assessment & Plan Note (Signed)
Pt's sxs and PE consistent w/ infxn.  Likely underlying allergy component as well.  Start Amox twice daily.  Continue Zyrtec.  Reviewed supportive care and red flags that should prompt return.  Pt expressed understanding and is in agreement w/ plan.

## 2014-11-02 NOTE — Progress Notes (Signed)
Pre visit review using our clinic review tool, if applicable. No additional management support is needed unless otherwise documented below in the visit note. 

## 2014-11-02 NOTE — Progress Notes (Signed)
   Subjective:    Patient ID: Evelyn Murray, female    DOB: [birth year], 44 y.o.   MRN: [id number]  HPI URI- sxs started 7 days ago w/ sore throat.  + nasal congestion, PND, HA.  + maxillary facial pain w/ R sided HA.  No fevers.  No N/V.  + sick contacts.  Intermittent dry cough.   Review of Systems For ROS see HPI     Objective:   Physical Exam  Constitutional: She appears well-developed and well-nourished. No distress.  HENT:  Head: Normocephalic and atraumatic.  Right Ear: Tympanic membrane normal.  Left Ear: Tympanic membrane normal.  Nose: Mucosal edema and rhinorrhea present. Right sinus exhibits maxillary sinus tenderness and frontal sinus tenderness. Left sinus exhibits no maxillary sinus tenderness and no frontal sinus tenderness.  Mouth/Throat: Uvula is midline and mucous membranes are normal. Posterior oropharyngeal erythema present. No oropharyngeal exudate.  Eyes: Conjunctivae and EOM are normal. Pupils are equal, round, and reactive to light.  Neck: Normal range of motion. Neck supple.  Cardiovascular: Normal rate, regular rhythm and normal heart sounds.   Pulmonary/Chest: Effort normal and breath sounds normal. No respiratory distress. She has no wheezes.  Lymphadenopathy:    She has no cervical adenopathy.  Vitals reviewed.         Assessment & Plan:

## 2014-11-02 NOTE — Patient Instructions (Signed)
Follow up as scheduled Start the Amoxicillin twice daily for the sinus infection Drink plenty of fluids REST! Call with any questions or concerns Hang in there!!!

## 2014-11-11 ENCOUNTER — Telehealth: Payer: Self-pay | Admitting: *Deleted

## 2014-11-11 NOTE — Telephone Encounter (Signed)
Unable to reach patient at time of Pre-Visit Call.  Left message for patient to return call when available.    

## 2014-11-13 ENCOUNTER — Encounter: Payer: Self-pay | Admitting: General Practice

## 2014-11-13 ENCOUNTER — Ambulatory Visit (INDEPENDENT_AMBULATORY_CARE_PROVIDER_SITE_OTHER): Payer: BLUE CROSS/BLUE SHIELD | Admitting: Family Medicine

## 2014-11-13 ENCOUNTER — Encounter: Payer: Self-pay | Admitting: Family Medicine

## 2014-11-13 ENCOUNTER — Encounter: Payer: BC Managed Care – PPO | Admitting: Family Medicine

## 2014-11-13 VITALS — BP 124/80 | HR 102 | Temp 98.5°F | Resp 16 | Ht 71.0 in | Wt 203.1 lb

## 2014-11-13 DIAGNOSIS — Z23 Encounter for immunization: Secondary | ICD-10-CM | POA: Diagnosis not present

## 2014-11-13 DIAGNOSIS — Z0000 Encounter for general adult medical examination without abnormal findings: Secondary | ICD-10-CM | POA: Diagnosis not present

## 2014-11-13 LAB — LIPID PANEL
CHOL/HDL RATIO: 3
Cholesterol: 115 mg/dL (ref 0–200)
HDL: 37.1 mg/dL — AB (ref >39.00)
LDL CALC: 64 mg/dL (ref 0–99)
NONHDL: 77.76
Triglycerides: 67 mg/dL (ref 0.0–149.0)
VLDL: 13.4 mg/dL (ref 0.0–40.0)

## 2014-11-13 LAB — CBC WITH DIFFERENTIAL/PLATELET
BASOS ABS: 0 10*3/uL (ref 0.0–0.1)
BASOS PCT: 0.2 % (ref 0.0–3.0)
EOS ABS: 0.2 10*3/uL (ref 0.0–0.7)
Eosinophils Relative: 3.2 % (ref 0.0–5.0)
HEMATOCRIT: 38.4 % (ref 36.0–46.0)
HEMOGLOBIN: 12.9 g/dL (ref 12.0–15.0)
LYMPHS PCT: 21.5 % (ref 12.0–46.0)
Lymphs Abs: 1.5 10*3/uL (ref 0.7–4.0)
MCHC: 33.5 g/dL (ref 30.0–36.0)
MCV: 92.4 fl (ref 78.0–100.0)
MONOS PCT: 7.3 % (ref 3.0–12.0)
Monocytes Absolute: 0.5 10*3/uL (ref 0.1–1.0)
NEUTROS ABS: 4.7 10*3/uL (ref 1.4–7.7)
Neutrophils Relative %: 67.8 % (ref 43.0–77.0)
Platelets: 221 10*3/uL (ref 150.0–400.0)
RBC: 4.16 Mil/uL (ref 3.87–5.11)
RDW: 12.8 % (ref 11.5–15.5)
WBC: 7 10*3/uL (ref 4.0–10.5)

## 2014-11-13 LAB — HEPATIC FUNCTION PANEL
ALT: 23 U/L (ref 0–35)
AST: 22 U/L (ref 0–37)
Albumin: 3.9 g/dL (ref 3.5–5.2)
Alkaline Phosphatase: 83 U/L (ref 39–117)
BILIRUBIN DIRECT: 0.1 mg/dL (ref 0.0–0.3)
BILIRUBIN TOTAL: 0.4 mg/dL (ref 0.2–1.2)
Total Protein: 7.3 g/dL (ref 6.0–8.3)

## 2014-11-13 LAB — BASIC METABOLIC PANEL
BUN: 8 mg/dL (ref 6–23)
CALCIUM: 9.3 mg/dL (ref 8.4–10.5)
CO2: 27 meq/L (ref 19–32)
CREATININE: 0.76 mg/dL (ref 0.40–1.20)
Chloride: 104 mEq/L (ref 96–112)
GFR: 105.28 mL/min (ref >60.00)
Glucose, Bld: 83 mg/dL (ref 70–99)
Potassium: 3.9 mEq/L (ref 3.5–5.1)
SODIUM: 139 meq/L (ref 135–145)

## 2014-11-13 LAB — VITAMIN D 25 HYDROXY (VIT D DEFICIENCY, FRACTURES): VITD: 46.63 ng/mL (ref 30.00–100.00)

## 2014-11-13 LAB — TSH: TSH: 1.83 u[IU]/mL (ref 0.35–4.50)

## 2014-11-13 MED ORDER — OMEPRAZOLE 40 MG PO CPDR: 40.0000 mg | DELAYED_RELEASE_CAPSULE | Freq: Every day | ORAL | Status: DC

## 2014-11-13 NOTE — Assessment & Plan Note (Signed)
Pt's PE WNL w/ exception of being overweight.  Is working hard on healthy diet and regular exercise.  Applauded her efforts.  UTD on GYN.  Check labs.  Anticipatory guidance provided.  Flu shot given.

## 2014-11-13 NOTE — Patient Instructions (Signed)
Follow up in 1 year or as needed We'll notify you of your lab results and make any changes if needed Keep up the good work on healthy diet and regular exercise- you're doing great! Call with any questions or concerns Happy Fall!!!

## 2014-11-13 NOTE — Progress Notes (Signed)
   Subjective:    Patient ID: Evelyn Murray, female    DOB: 1970/03/04, 44 y.o.   MRN: [id number]  HPI UTD w/ GYN, mammo ([facility]).   Review of Systems Patient reports no vision/ hearing changes, adenopathy,fever, weight change,  persistant/recurrent hoarseness , swallowing issues, chest pain, palpitations, edema, persistant/recurrent cough, hemoptysis, dyspnea (rest/exertional/paroxysmal nocturnal), gastrointestinal bleeding (melena, rectal bleeding), abdominal pain, bowel changes, GU symptoms (dysuria, hematuria, incontinence), Gyn symptoms (abnormal  bleeding, pain),  syncope, focal weakness, memory loss, numbness & tingling, skin/hair/nail changes, abnormal bruising or bleeding, anxiety, or depression.   + GERD despite omeprazole    Objective:   Physical Exam General Appearance:    Alert, cooperative, no distress, appears stated age  Head:    Normocephalic, without obvious abnormality, atraumatic  Eyes:    PERRL, conjunctiva/corneas clear, EOM's intact, fundi    benign, both eyes  Ears:    Normal TM's and external ear canals, both ears  Nose:   Nares normal, septum midline, mucosa normal, no drainage    or sinus tenderness  Throat:   Lips, mucosa, and tongue normal; teeth and gums normal  Neck:   Supple, symmetrical, trachea midline, no adenopathy;    Thyroid: no enlargement/tenderness/nodules  Back:     Symmetric, no curvature, ROM normal, no CVA tenderness  Lungs:     Clear to auscultation bilaterally, respirations unlabored  Chest Wall:    No tenderness or deformity   Heart:    Regular rate and rhythm, S1 and S2 normal, no murmur, rub   or gallop  Breast Exam:    Deferred to GYN  Abdomen:     Soft, non-tender, bowel sounds active all four quadrants,    no masses, no organomegaly  Genitalia:    Deferred to GYN  Rectal:    Extremities:   Extremities normal, atraumatic, no cyanosis or edema  Pulses:   2+ and symmetric all extremities  Skin:   Skin color, texture, turgor  normal, no rashes or lesions  Lymph nodes:   Cervical, supraclavicular, and axillary nodes normal  Neurologic:   CNII-XII intact, normal strength, sensation and reflexes    throughout          Assessment & Plan:

## 2014-11-13 NOTE — Progress Notes (Signed)
Pre visit review using our clinic review tool, if applicable. No additional management support is needed unless otherwise documented below in the visit note. 

## 2014-12-02 LAB — HM PAP SMEAR: HM PAP: NORMAL

## 2014-12-03 ENCOUNTER — Encounter: Payer: Self-pay | Admitting: General Practice

## 2014-12-30 ENCOUNTER — Other Ambulatory Visit: Payer: Self-pay | Admitting: Family Medicine

## 2014-12-31 NOTE — Telephone Encounter (Signed)
Medication filled to pharmacy as requested.   

## 2015-01-25 ENCOUNTER — Other Ambulatory Visit: Payer: Self-pay | Admitting: Family Medicine

## 2015-01-26 NOTE — Telephone Encounter (Signed)
Medication filled to pharmacy as requested.   

## 2015-01-31 ENCOUNTER — Other Ambulatory Visit: Payer: Self-pay | Admitting: Family Medicine

## 2015-02-01 NOTE — Telephone Encounter (Signed)
Medication filled to pharmacy as requested.   

## 2015-02-02 ENCOUNTER — Encounter: Payer: Self-pay | Admitting: Physician Assistant

## 2015-02-02 ENCOUNTER — Ambulatory Visit (INDEPENDENT_AMBULATORY_CARE_PROVIDER_SITE_OTHER): Payer: 59 | Admitting: Physician Assistant

## 2015-02-02 VITALS — BP 130/72 | HR 88 | Temp 98.0°F | Ht 71.0 in | Wt 201.2 lb

## 2015-02-02 DIAGNOSIS — G43009 Migraine without aura, not intractable, without status migrainosus: Secondary | ICD-10-CM

## 2015-02-02 MED ORDER — KETOROLAC TROMETHAMINE 60 MG/2ML IM SOLN
60.0000 mg | Freq: Once | INTRAMUSCULAR | Status: AC
  Administered 2015-02-02: 60 mg via INTRAMUSCULAR

## 2015-02-02 MED ORDER — AMITRIPTYLINE HCL 10 MG PO TABS: 20.0000 mg | ORAL_TABLET | Freq: Every day | ORAL | Status: DC

## 2015-02-02 NOTE — Assessment & Plan Note (Signed)
Im Toradol given today to break cycle. Will restart Elavil nightly. Medication refilled. Further fills will be up to PCP. Supportive measures reviewed. Follow-up as directed.

## 2015-02-02 NOTE — Progress Notes (Signed)
Pre visit review using our clinic review tool, if applicable. No additional management support is needed unless otherwise documented below in the visit note. 

## 2015-02-02 NOTE — Addendum Note (Signed)
Addended by: Verdie ShireBAYNES, ANGELA M on: 02/02/2015 12:04 PM   Modules accepted: Orders

## 2015-02-02 NOTE — Patient Instructions (Signed)
Please restart your Elavil. Stay hydrated and eat a well-balance diet. The medication given today should break your migraine cycle Follow-up with Dr. Beverely Lowabori as scheduled.

## 2015-02-02 NOTE — Progress Notes (Signed)
Patient presents to clinic today c/o 3 days of intermittent headaches associated with nausea, photophobia and phonophobia. Has history of migraines and states this is a classic migraine for her. Has been taking Imitrex which helps but does not completely get rid of the headache.  Past Medical History  Diagnosis Date  . Allergy   . Chicken pox   . Migraines   . UTI (lower urinary tract infection)   . Chlamydia infection 02/1998    Treated with  oxacillin  . Hx of migraines   . Abnormal Pap smear 10/1998  . LGSIL (low grade squamous intraepithelial dysplasia)   . HPV in female 2001  . ASCUS (atypical squamous cells of undetermined significance) on Pap smear 2006  . Insomnia 2007  . CIN I (cervical intraepithelial neoplasia I) 2011    Current Outpatient Prescriptions on File Prior to Visit  Medication Sig Dispense Refill  . cyclobenzaprine (FLEXERIL) 10 MG tablet TAKE 1 TABLET BY MOUTH THREE TIMES DAILY AS NEEDED FOR MUSCLE SPASMS 45 tablet 1  . fluticasone (FLONASE) 50 MCG/ACT nasal spray USE 2 SPRAYS IN EACH NOSTRIL EVERY DAY FOR STUFFY NOSE OR DRAINAGE 48 g 0  . omeprazole (PRILOSEC) 40 MG capsule Take 1 capsule (40 mg total) by mouth daily. 30 capsule 6  . SUMAtriptan (IMITREX) 50 MG tablet TAKE 1 TABLET BY MOUTH ONCE, MAY REPEAT IN 2 HOURS IF HEADACHE PERSISTS OR RECURS 10 tablet 3  . triamcinolone cream (KENALOG) 0.1 %     . folic acid (FOLVITE) 1 MG tablet Take 1 tablet (1 mg total) by mouth daily. (Patient not taking: Reported on 02/02/2015) 90 tablet 4   No current facility-administered medications on file prior to visit.    No Known Allergies  Family History  Problem Relation Age of Onset  . Hypertension Mother   . Hypertension Father   . Hypertension Maternal Grandfather     Social History   Social History  . Marital Status: Married    Spouse Name: N/A  . Number of Children: N/A  . Years of Education: N/A   Social History Main Topics  . Smoking status:  Never Smoker   . Smokeless tobacco: Never Used  . Alcohol Use: No  . Drug Use: No  . Sexual Activity: Not Asked   Other Topics Concern  . None   Social History Narrative    Review of Systems - See HPI.  All other ROS are negative.  BP 130/72 mmHg  Pulse 88  Temp(Src) 98 F (36.7 C) (Oral)  Ht  (1.803 m)  Wt 201 lb 3.2 oz (91.264 kg)  BMI 28.07 kg/m2  SpO2 98%  Physical Exam  Constitutional: She is oriented to person, place, and time and well-developed, well-nourished, and in no distress.  HENT:  Head: Normocephalic and atraumatic.  Right Ear: External ear normal.  Left Ear: External ear normal.  Nose: Nose normal.  Mouth/Throat: Oropharynx is clear and moist. No oropharyngeal exudate.  TM within normal limits.  Eyes: Conjunctivae are normal. Pupils are equal, round, and reactive to light.  Cardiovascular: Normal rate, regular rhythm, normal heart sounds and intact distal pulses.   Pulmonary/Chest: Effort normal and breath sounds normal. No respiratory distress. She has no wheezes. She has no rales. She exhibits no tenderness.  Neurological: She is alert and oriented to person, place, and time.  Skin: Skin is warm and dry. No rash noted.  Psychiatric: Affect normal.  Vitals reviewed.   Recent Results (from the  past 2160 hour(s))  Lipid panel     Status: Abnormal   Collection Time: 11/13/14  8:46 AM  Result Value Ref Range   Cholesterol 115 0 - 200 mg/dL    Comment: ATP III Classification       Desirable:  < 200 mg/dL               Borderline High:  200 - 239 mg/dL          High:  > = 161 mg/dL   Triglycerides 09.6 0.0 - 149.0 mg/dL    Comment: Normal:  <045 mg/dLBorderline High:  150 - 199 mg/dL   HDL 40.98 (L) >11.91 mg/dL   VLDL 47.8 0.0 - 29.5 mg/dL   LDL Cholesterol 64 0 - 99 mg/dL   Total CHOL/HDL Ratio 3     Comment:                Men          Women1/2 Average Risk     3.4          3.3Average Risk          5.0          4.42X Average Risk          9.6           7.13X Average Risk          15.0          11.0                       NonHDL 77.76     Comment: NOTE:  Non-HDL goal should be 30 mg/dL higher than patient's LDL goal (i.e. LDL goal of < 70 mg/dL, would have non-HDL goal of < 100 mg/dL)  Basic metabolic panel     Status: None   Collection Time: 11/13/14  8:46 AM  Result Value Ref Range   Sodium 139 135 - 145 mEq/L   Potassium 3.9 3.5 - 5.1 mEq/L   Chloride 104 96 - 112 mEq/L   CO2 27 19 - 32 mEq/L   Glucose, Bld 83 70 - 99 mg/dL   BUN 8 6 - 23 mg/dL   Creatinine, Ser 6.21 0.40 - 1.20 mg/dL   Calcium 9.3 8.4 - 30.8 mg/dL   GFR 657.84 >69.62 mL/min  TSH     Status: None   Collection Time: 11/13/14  8:46 AM  Result Value Ref Range   TSH 1.83 0.35 - 4.50 uIU/mL  Hepatic function panel     Status: None   Collection Time: 11/13/14  8:46 AM  Result Value Ref Range   Total Bilirubin 0.4 0.2 - 1.2 mg/dL   Bilirubin, Direct 0.1 0.0 - 0.3 mg/dL   Alkaline Phosphatase 83 39 - 117 U/L   AST 22 0 - 37 U/L   ALT 23 0 - 35 U/L   Total Protein 7.3 6.0 - 8.3 g/dL   Albumin 3.9 3.5 - 5.2 g/dL  CBC with Differential/Platelet     Status: None   Collection Time: 11/13/14  8:46 AM  Result Value Ref Range   WBC 7.0 4.0 - 10.5 K/uL   RBC 4.16 3.87 - 5.11 Mil/uL   Hemoglobin 12.9 12.0 - 15.0 g/dL   HCT 95.2 84.1 - 32.4 %   MCV 92.4 78.0 - 100.0 fl   MCHC 33.5 30.0 - 36.0 g/dL   RDW 40.1 02.7 - 25.3 %   Platelets 221.0  150.0 - 400.0 K/uL   Neutrophils Relative % 67.8 43.0 - 77.0 %   Lymphocytes Relative 21.5 12.0 - 46.0 %   Monocytes Relative 7.3 3.0 - 12.0 %   Eosinophils Relative 3.2 0.0 - 5.0 %   Basophils Relative 0.2 0.0 - 3.0 %   Neutro Abs 4.7 1.4 - 7.7 K/uL   Lymphs Abs 1.5 0.7 - 4.0 K/uL   Monocytes Absolute 0.5 0.1 - 1.0 K/uL   Eosinophils Absolute 0.2 0.0 - 0.7 K/uL   Basophils Absolute 0.0 0.0 - 0.1 K/uL  Vit D  25 hydroxy (rtn osteoporosis monitoring)     Status: None   Collection Time: 11/13/14  8:46 AM  Result Value Ref  Range   VITD 46.63 30.00 - 100.00 ng/mL  HM PAP SMEAR     Status: None   Collection Time: 12/02/14 12:00 AM  Result Value Ref Range   HM Pap smear normal     Assessment/Plan: Migraine Im Toradol given today to break cycle. Will restart Elavil nightly. Medication refilled. Further fills will be up to PCP. Supportive measures reviewed. Follow-up as directed.

## 2015-06-15 ENCOUNTER — Other Ambulatory Visit: Payer: Self-pay | Admitting: Physician Assistant

## 2015-06-15 NOTE — Telephone Encounter (Signed)
Will defer further refills of patient's medications to PCP  

## 2015-06-16 ENCOUNTER — Other Ambulatory Visit: Payer: Self-pay | Admitting: Family Medicine

## 2015-06-16 NOTE — Telephone Encounter (Signed)
Medication filled to pharmacy as requested.   

## 2015-09-01 ENCOUNTER — Other Ambulatory Visit: Payer: Self-pay | Admitting: Family Medicine

## 2015-09-02 NOTE — Telephone Encounter (Signed)
Medication filled to pharmacy as requested.   

## 2015-10-06 ENCOUNTER — Other Ambulatory Visit: Payer: Self-pay | Admitting: Family Medicine

## 2015-10-20 ENCOUNTER — Other Ambulatory Visit (HOSPITAL_BASED_OUTPATIENT_CLINIC_OR_DEPARTMENT_OTHER): Payer: Self-pay | Admitting: Obstetrics and Gynecology

## 2015-10-20 DIAGNOSIS — Z1231 Encounter for screening mammogram for malignant neoplasm of breast: Secondary | ICD-10-CM

## 2015-10-26 ENCOUNTER — Ambulatory Visit (HOSPITAL_BASED_OUTPATIENT_CLINIC_OR_DEPARTMENT_OTHER)
Admission: RE | Admit: 2015-10-26 | Discharge: 2015-10-26 | Disposition: A | Discharge status: Home / Self Care | Payer: 59 | Source: Ambulatory Visit | Attending: Obstetrics and Gynecology | Admitting: Obstetrics and Gynecology

## 2015-10-26 DIAGNOSIS — Z1231 Encounter for screening mammogram for malignant neoplasm of breast: Secondary | ICD-10-CM

## 2015-10-28 ENCOUNTER — Ambulatory Visit (INDEPENDENT_AMBULATORY_CARE_PROVIDER_SITE_OTHER): Payer: 59 | Admitting: Family Medicine

## 2015-10-28 ENCOUNTER — Encounter: Payer: Self-pay | Admitting: Family Medicine

## 2015-10-28 VITALS — BP 132/92 | HR 90 | Temp 98.6°F | Ht 70.0 in | Wt 201.8 lb

## 2015-10-28 DIAGNOSIS — R51 Headache: Secondary | ICD-10-CM

## 2015-10-28 DIAGNOSIS — R519 Headache, unspecified: Secondary | ICD-10-CM

## 2015-10-28 DIAGNOSIS — R6889 Other general symptoms and signs: Secondary | ICD-10-CM

## 2015-10-28 DIAGNOSIS — R448 Other symptoms and signs involving general sensations and perceptions: Secondary | ICD-10-CM

## 2015-10-28 DIAGNOSIS — Z23 Encounter for immunization: Secondary | ICD-10-CM

## 2015-10-28 MED ORDER — BUTALBITAL-APAP-CAFFEINE 50-325-40 MG PO TABS: 1.0000 | ORAL_TABLET | Freq: Four times a day (QID) | ORAL | 0 refills | Status: DC | PRN

## 2015-10-28 MED ORDER — AMOXICILLIN 500 MG PO CAPS: 1000.0000 mg | ORAL_CAPSULE | Freq: Two times a day (BID) | ORAL | 0 refills | Status: DC

## 2015-10-28 NOTE — Progress Notes (Signed)
[facility] [address], [address] [place], [place] [address] [phone] [phone]  Date:  10/28/2015   Name:  Evelyn Murray   DOB:  1970/04/05   MRN:  [id number]  PCP:  Neena RhymesKatherine Tabori, MD    Chief Complaint: Establish Care (c/o headache that started last Friday. Constant upper back pain x 1 year. Pt has been seen by neurologist. )   History of Present Illness:  Evelyn Murray is a 45 y.o. very pleasant female patient who presents with the following:  History of migraine headache, insomnia. She is here today with a HA for 6 days It tends to be worse at night or when she bends down It does not seem to be a migraine- imitrex did not clear it and this generally works for her migraine.  She also tried her flexeril and is still on her flonase spray No cough, fever, sneezing, nasal congestion.  No aura. No phono or photophobia.    She has noted a pain in the back of her neck, across her shoulder blades and through her upper back;  This has been present for a year on and off.  She has seen neurology, had a nerve conduction test and started amitriptyline.  However she stopped taking the amitriptyline a couple of months ago as it seemed to be triggering a nighttime headache.    She has very light menses since she had her ablation.  Last occurred 2-3 weeks ago.    No trauma.   She did have x-rays of her neck about 10 years ago.    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  However this is not new to her  She did have an eye exam last year.    She took sudafed and aleve last night but nothing so far toay  BP Readings from Last 3 Encounters:  10/28/15 (!) 145/84  02/02/15 130/72  11/13/14 124/80      Patient Active Problem List   Diagnosis Date Noted  . Migraine 12/05/2013  . Insomnia secondary to anxiety 11/07/2013  . Hip flexor tendinitis 07/28/2013  . Cervical disc disorder with  radiculopathy of cervical region 07/28/2013  . Routine general medical examination at a health care facility 11/05/2012  . Dysplasia of cervix, low grade (CIN 1) 09/06/2011  . Asthma with allergic rhinitis 08/20/2011  . Unspecified general medical examination 08/17/2011  . Pinched nerve 02/16/2011  . Low back pain 02/16/2011  . Insomnia 02/16/2011    Past Medical History:  Diagnosis Date  . Abnormal Pap smear 10/1998  . Allergy   . ASCUS (atypical squamous cells of undetermined significance) on Pap smear 2006  . Chicken pox   . Chlamydia infection 02/1998   Treated with  oxacillin  . CIN I (cervical intraepithelial neoplasia I) 2011  . HPV in female 2001  . Hx of migraines   . Insomnia 2007  . LGSIL (low grade squamous intraepithelial dysplasia)   . Migraines   . UTI (lower urinary tract infection)     Past Surgical History:  Procedure Laterality Date  . CESAREAN SECTION      Social History  Substance Use Topics  . Smoking status: Never Smoker  . Smokeless tobacco: Never Used  . Alcohol use No    Family History  Problem Relation Age of Onset  . Hypertension Mother   . Hypertension Father   . Hypertension Maternal  Grandfather     No Known Allergies  Medication list has been reviewed and updated.  Current Outpatient Prescriptions on File Prior to Visit  Medication Sig Dispense Refill  . amitriptyline (ELAVIL) 10 MG tablet TAKE 2 TABLETS(20 MG) BY MOUTH AT BEDTIME 60 tablet 6  . cyclobenzaprine (FLEXERIL) 10 MG tablet TAKE 1 TABLET BY MOUTH THREE TIMES DAILY AS NEEDED FOR MUSCLE SPASMS 45 tablet 1  . fluticasone (FLONASE) 50 MCG/ACT nasal spray USE 2 SPRAYS IN EACH NOSTRIL EVERY DAY FOR STUFFY NOSE OR DRAINAGE 48 g 3  . omeprazole (PRILOSEC) 40 MG capsule TAKE 1 CAPSULE(40 MG) BY MOUTH DAILY 30 capsule 6  . SUMAtriptan (IMITREX) 50 MG tablet TAKE 1 TABLET BY MOUTH ONCE, MAY REPEAT IN 2 HOURS IF HEADACHE PERSISTS OR RECURS 10 tablet 3  . triamcinolone cream  (KENALOG) 0.1 %      No current facility-administered medications on file prior to visit.     Review of Systems:  As per HPI- otherwise negative.   Physical Examination:  Vitals:   10/28/15 1657  BP: (!) 145/84  Pulse: (!) 101  Temp: 98.6 F (37 C)    Ideal Body Weight:    GEN: WDWN, NAD, Non-toxic, A & O x 3, overweight/ tall build.  Looks well, here today with her husband HEENT: Atraumatic, Normocephalic. Neck supple. No masses, No LAD.  Bilateral TM wnl, oropharynx normal.  PEERL,EOMI.    Nasal cavity is inflamed and her ethmoid sinuese Ears and Nose: No external deformity. CV: RRR, No M/G/R. No JVD. No thrill. No extra heart sounds. PULM: CTA B, no wheezes, crackles, rhonchi. No retractions. No resp. distress. No accessory muscle use. ABD: S, NT, ND EXTR: No c/c/e NEURO Normal gait.   Complete neuro exam including strength, sensation, DTR of all extremiites, sensation and movement of face, and romberg testing PSYCH: Normally interactive. Conversant. Not depressed or anxious appearing.  Calm demeanor.    Assessment and Plan: Persistent headaches - Plan: butalbital-acetaminophen-caffeine (FIORICET) 50-325-40 MG tablet  Facial pressure - Plan: amoxicillin (AMOXIL) 500 MG capsule  Here today with headache for about one week. Per pt the HA is not especially severe- not "worst headache of life," it is just persistent.  We will have her try fiorciet and will treat with amox for a possible sinus infection She will keep me closely apprised of her progress and will seek care if getting worse in any way  Signed Abbe Amsterdam, MD

## 2015-10-28 NOTE — Progress Notes (Signed)
Pre visit review using our clinic review tool, if applicable. No additional management support is needed unless otherwise documented below in the visit note. 

## 2015-10-28 NOTE — Patient Instructions (Signed)
It was very nice to see you today We are going to use a course of amoxicillin antibiotic for possible sinus infection, and fioricet as needed for your current headache.  I'll call and check in with you tomorrow. However, if you have any severe worsening of your symptoms in the meantime seek emergency care. Otherwise if your headaches persist we may want to perform imaging- CT or MRI- of your head.    You got your flu shot today as well I think it would be fine for you to see a chiropractor or have acupuncture for the pain in your shoulders and neck

## 2015-10-29 ENCOUNTER — Telehealth: Payer: Self-pay | Admitting: Family Medicine

## 2015-10-29 NOTE — Telephone Encounter (Signed)
Called her back- no answer.  Called [facility]- there is not any alternative drug that is covered for her .  20 pills is $42.  Called pt and LMOM- if she would like she may pick up 5-10 pills to try that is certainly an option.  Let me know if she needs anything further

## 2015-10-29 NOTE — Telephone Encounter (Signed)
Relation to UJ:WJXBpt:self Call back [phone]  Reason for call:  Patient states insurance will not cover butalbital-acetaminophen-caffeine (FIORICET) 50-325-40 MG tablet, medication is to expensive requesting an alternate. Please advise

## 2015-11-04 ENCOUNTER — Telehealth: Payer: Self-pay | Admitting: Family Medicine

## 2015-11-04 NOTE — Telephone Encounter (Signed)
Patient calling to schedule an appt with Dr. Beverely Lowabori for for chronic back pain.  I informed patient we have her listed as Dr. Cyndie Chimeopland's patient and I would be happy to schedule her with her pcp for his problem.    Patient states she has seen Dr. Patsy Lageropland for this issue last week and was referred to a chiropractic and given meds which have not help.  I advised patient since she was not Dr. Cyndie Chimeopland's patient we would need approval in order for her to switch back to Dr. Beverely Lowabori.  Pt said never mind she would go to an urgent care facility today instead.

## 2015-11-04 NOTE — Telephone Encounter (Signed)
Just an FYI, Per cc'd chart you had me update pt to Dr. Patsy Lageropland as PCP.

## 2015-11-17 ENCOUNTER — Other Ambulatory Visit: Payer: Self-pay | Admitting: Specialist

## 2015-11-17 DIAGNOSIS — R51 Headache: Principal | ICD-10-CM

## 2015-11-17 DIAGNOSIS — R519 Headache, unspecified: Secondary | ICD-10-CM

## 2015-11-27 ENCOUNTER — Other Ambulatory Visit: Payer: 59

## 2015-12-04 ENCOUNTER — Ambulatory Visit
Admission: RE | Admit: 2015-12-04 | Discharge: 2015-12-04 | Disposition: A | Discharge status: Home / Self Care | Payer: 59 | Source: Ambulatory Visit | Attending: Specialist | Admitting: Specialist

## 2015-12-04 DIAGNOSIS — R51 Headache: Principal | ICD-10-CM

## 2015-12-04 DIAGNOSIS — R519 Headache, unspecified: Secondary | ICD-10-CM

## 2015-12-09 ENCOUNTER — Encounter: Payer: Self-pay | Admitting: Family Medicine

## 2015-12-09 ENCOUNTER — Ambulatory Visit (INDEPENDENT_AMBULATORY_CARE_PROVIDER_SITE_OTHER): Payer: 59 | Admitting: Family Medicine

## 2015-12-09 VITALS — BP 134/78 | HR 104 | Temp 98.5°F | Ht 70.0 in | Wt 197.0 lb

## 2015-12-09 DIAGNOSIS — Z13220 Encounter for screening for lipoid disorders: Secondary | ICD-10-CM

## 2015-12-09 DIAGNOSIS — Z130 Encounter for screening for diseases of the blood and blood-forming organs and certain disorders involving the immune mechanism: Secondary | ICD-10-CM | POA: Diagnosis not present

## 2015-12-09 DIAGNOSIS — Z131 Encounter for screening for diabetes mellitus: Secondary | ICD-10-CM

## 2015-12-09 DIAGNOSIS — Z0000 Encounter for general adult medical examination without abnormal findings: Secondary | ICD-10-CM

## 2015-12-09 DIAGNOSIS — Z1329 Encounter for screening for other suspected endocrine disorder: Secondary | ICD-10-CM

## 2015-12-09 LAB — COMPREHENSIVE METABOLIC PANEL
ALBUMIN: 4.1 g/dL (ref 3.5–5.2)
ALK PHOS: 71 U/L (ref 39–117)
ALT: 24 U/L (ref 0–35)
AST: 23 U/L (ref 0–37)
BUN: 8 mg/dL (ref 6–23)
CALCIUM: 9.2 mg/dL (ref 8.4–10.5)
CO2: 27 mEq/L (ref 19–32)
Chloride: 106 mEq/L (ref 96–112)
Creatinine, Ser: 0.81 mg/dL (ref 0.40–1.20)
GFR: 97.36 mL/min (ref >60.00)
Glucose, Bld: 94 mg/dL (ref 70–99)
POTASSIUM: 3.8 meq/L (ref 3.5–5.1)
SODIUM: 140 meq/L (ref 135–145)
TOTAL PROTEIN: 7.6 g/dL (ref 6.0–8.3)
Total Bilirubin: 0.4 mg/dL (ref 0.2–1.2)

## 2015-12-09 LAB — CBC
HEMATOCRIT: 38.2 % (ref 36.0–46.0)
HEMOGLOBIN: 13 g/dL (ref 12.0–15.0)
MCHC: 34 g/dL (ref 30.0–36.0)
MCV: 92.3 fl (ref 78.0–100.0)
PLATELETS: 225 10*3/uL (ref 150.0–400.0)
RBC: 4.14 Mil/uL (ref 3.87–5.11)
RDW: 12.5 % (ref 11.5–15.5)
WBC: 7.1 10*3/uL (ref 4.0–10.5)

## 2015-12-09 LAB — HEMOGLOBIN A1C: HEMOGLOBIN A1C: 5.3 % (ref 4.6–6.5)

## 2015-12-09 LAB — LIPID PANEL
CHOLESTEROL: 139 mg/dL (ref 0–200)
HDL: 41.5 mg/dL (ref >39.00)
LDL Cholesterol: 86 mg/dL (ref 0–99)
NonHDL: 97.89
Total CHOL/HDL Ratio: 3
Triglycerides: 59 mg/dL (ref 0.0–149.0)
VLDL: 11.8 mg/dL (ref 0.0–40.0)

## 2015-12-09 LAB — TSH: TSH: 2.19 u[IU]/mL (ref 0.35–4.50)

## 2015-12-09 NOTE — Progress Notes (Signed)
[facility] [address], [address] [place], [place] [address] [phone]  Date:  12/09/2015   Name:  Evelyn Murray   DOB:  Apr 28, 1970   MRN:  [id number]  PCP:  Abbe Amsterdam, MD    Chief Complaint: Annual Exam (Last Pap: 11/2014 and last mammogram 10/2015. Up to date on vaccines. )   History of Present Illness:  Evelyn Murray is a 45 y.o. very pleasant female patient who presents with the following:  Here today for a CPE History of migraine, insomnia She did have labs one year ago which looked good overall mammo done last month Colonoscopy not yet due Tetanus shot is UTD- due next year Flu shot done for the year She is fasting today for labs  She states that she still has "pain down the back of my neck, through my shoulder blade and down my right arm" [facility]y- she had an MRI of her brain about 5 days ago.  This was normal   Per neurology she is now taking baclofen prn and zonisamide - she is up to 100 mg of zonisamide now.  She feels that her pain is somewhat reduced but not gone. She is frustrated by lack of answers and suspect that her problem may be more in her neck than in her head.  She did see neurosurgery about 2 years ago for a similar problem She had an ablation a few years ago  Patient Active Problem List   Diagnosis Date Noted  . Migraine 12/05/2013  . Insomnia secondary to anxiety 11/07/2013  . Hip flexor tendinitis 07/28/2013  . 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 Routine general medical examination at a health care facility 11/05/2012  . Dysplasia of cervix, low grade (CIN 1) 09/06/2011  . Asthma with allergic rhinitis 08/20/2011  . Unspecified general medical examination 08/17/2011  . Pinched nerve 02/16/2011  . Low back pain 02/16/2011  . Insomnia 02/16/2011    Past Medical History:  Diagnosis Date  .  Abnormal Pap smear 10/1998  . Allergy   . ASCUS (atypical squamous cells of undetermined significance) on Pap smear 2006  . Chicken pox   . Chlamydia infection 02/1998   Treated with  oxacillin  . CIN I (cervical intraepithelial neoplasia I) 2011  . HPV in female 2001  . Hx of migraines   . Insomnia 2007  . LGSIL (low grade squamous intraepithelial dysplasia)   . Migraines   . UTI (lower urinary tract infection)     Past Surgical History:  Procedure Laterality Date  . CESAREAN SECTION      Social History  Substance Use Topics  . Smoking status: Never Smoker  . Smokeless tobacco: Never Used  . Alcohol use No    Family History  Problem Relation Age of Onset  . Hypertension Mother   . Hypertension Father   . Hypertension Maternal Grandfather     No Known Allergies  Medication list has been reviewed and updated.  Current Outpatient Prescriptions on File Prior to Visit  Medication Sig Dispense Refill  . cyclobenzaprine (FLEXERIL) 10 MG tablet TAKE 1 TABLET BY MOUTH THREE TIMES DAILY AS NEEDED FOR MUSCLE SPASMS 45 tablet 1  . fluticasone (FLONASE) 50 MCG/ACT nasal spray USE 2 SPRAYS IN EACH NOSTRIL EVERY DAY FOR STUFFY NOSE OR DRAINAGE 48 g 3  . omeprazole (PRILOSEC) 40 MG capsule TAKE  1 CAPSULE(40 MG) BY MOUTH DAILY 30 capsule 6  . SUMAtriptan (IMITREX) 50 MG tablet TAKE 1 TABLET BY MOUTH ONCE, MAY REPEAT IN 2 HOURS IF HEADACHE PERSISTS OR RECURS 10 tablet 3  . triamcinolone cream (KENALOG) 0.1 %      No current facility-administered medications on file prior to visit.     Review of Systems:  As per HPI- otherwise negative.  No fever, chills, CP, SOB, nausea or vomiting    Physical Examination: Vitals:   12/09/15 0827  BP: 134/78  Pulse: (!) 104  Temp: 98.5 F (36.9 C)   Vitals:   12/09/15 0827  Weight: 197 lb (89.4 kg)  Height: 5\' 10"  (1.778 m)   Body mass index is 28.27 kg/m. Ideal Body Weight: Weight in (lb) to have BMI = 25: 173.9  GEN: WDWN, NAD,  Non-toxic, A & O x 3, mild overweight, looks well  HEENT: Atraumatic, Normocephalic. Neck supple. No masses, No LAD. Ears and Nose: No external deformity. CV: RRR, No M/G/R. No JVD. No thrill. No extra heart sounds. PULM: CTA B, no wheezes, crackles, rhonchi. No retractions. No resp. distress. No accessory muscle use. ABD: S, NT, ND. No rebound. No HSM. EXTR: No c/c/e NEURO Normal gait.  PSYCH: Normally interactive. Conversant. Not depressed or anxious appearing.  Calm demeanor.    Assessment and Plan: Physical exam  Screening for deficiency anemia - Plan: CBC  Screening for diabetes mellitus - Plan: Comprehensive metabolic panel, Hemoglobin A1C  Screening for thyroid disorder - Plan: TSH  Screening for hyperlipidemia - Plan: Lipid panel  Here today for a CPE Screening labs as above Immunizations are UTD She is not on any medication for BP or blood sugar Explained that I also do not know why she is having neck and head pain.  I would encourage her to follow-up with neurology about her persistent pain.  I am also glad to order an MRI of her neck if she would like  Will plan further follow- up pending labs.   Signed Abbe AmsterdamJessica Copland, MD

## 2015-12-09 NOTE — Patient Instructions (Addendum)
It was nice to see you today- I will check labs for you and will be in touch with the results.  I am sorry that you are having such a hard time with your headaches and neck/ back pain.  If you like I am certainly glad to order an MRI of your neck.  You could also try acupuncture therapy; there is evidence that this can be helpful for headaches and back pain

## 2015-12-09 NOTE — Progress Notes (Signed)
Pre visit review using our clinic review tool, if applicable. No additional management support is needed unless otherwise documented below in the visit note. 

## 2016-01-17 ENCOUNTER — Other Ambulatory Visit: Payer: Self-pay | Admitting: Family Medicine

## 2016-01-19 NOTE — Telephone Encounter (Signed)
Called her to discuss- she only takes the baclofen about once a week for her migraine HA- she otherwise uses the flexeril for her back and neck pain

## 2016-02-18 ENCOUNTER — Other Ambulatory Visit: Payer: Self-pay | Admitting: Family Medicine

## 2016-03-06 DIAGNOSIS — G43839 Menstrual migraine, intractable, without status migrainosus: Secondary | ICD-10-CM | POA: Diagnosis not present

## 2016-03-06 DIAGNOSIS — G43719 Chronic migraine without aura, intractable, without status migrainosus: Secondary | ICD-10-CM | POA: Diagnosis not present

## 2016-03-06 DIAGNOSIS — G43019 Migraine without aura, intractable, without status migrainosus: Secondary | ICD-10-CM | POA: Diagnosis not present

## 2016-04-21 ENCOUNTER — Other Ambulatory Visit: Payer: Self-pay | Admitting: Family Medicine

## 2016-06-26 ENCOUNTER — Ambulatory Visit (INDEPENDENT_AMBULATORY_CARE_PROVIDER_SITE_OTHER): Payer: 59 | Admitting: Medical

## 2016-06-26 ENCOUNTER — Telehealth: Payer: Self-pay | Admitting: Family Medicine

## 2016-06-26 ENCOUNTER — Emergency Department (HOSPITAL_BASED_OUTPATIENT_CLINIC_OR_DEPARTMENT_OTHER)
Admission: EM | Admit: 2016-06-26 | Discharge: 2016-06-26 | Disposition: A | Discharge status: Home / Self Care | Payer: 59 | Attending: Emergency Medicine | Admitting: Emergency Medicine

## 2016-06-26 ENCOUNTER — Emergency Department (HOSPITAL_BASED_OUTPATIENT_CLINIC_OR_DEPARTMENT_OTHER): Payer: 59

## 2016-06-26 ENCOUNTER — Encounter (HOSPITAL_BASED_OUTPATIENT_CLINIC_OR_DEPARTMENT_OTHER): Payer: Self-pay | Admitting: Emergency Medicine

## 2016-06-26 VITALS — BP 133/82 | HR 86 | Temp 98.2°F | Resp 16 | Ht 70.0 in | Wt 182.8 lb

## 2016-06-26 DIAGNOSIS — R0609 Other forms of dyspnea: Secondary | ICD-10-CM | POA: Diagnosis not present

## 2016-06-26 DIAGNOSIS — R0789 Other chest pain: Secondary | ICD-10-CM | POA: Insufficient documentation

## 2016-06-26 DIAGNOSIS — J45909 Unspecified asthma, uncomplicated: Secondary | ICD-10-CM | POA: Insufficient documentation

## 2016-06-26 DIAGNOSIS — R0600 Dyspnea, unspecified: Secondary | ICD-10-CM | POA: Diagnosis not present

## 2016-06-26 DIAGNOSIS — R0602 Shortness of breath: Secondary | ICD-10-CM | POA: Diagnosis not present

## 2016-06-26 LAB — BASIC METABOLIC PANEL
Anion gap: 9 (ref 5–15)
BUN: 7 mg/dL (ref 6–20)
CHLORIDE: 104 mmol/L (ref 101–111)
CO2: 26 mmol/L (ref 22–32)
Calcium: 9 mg/dL (ref 8.9–10.3)
Creatinine, Ser: 0.71 mg/dL (ref 0.44–1.00)
GFR calc non Af Amer: >60 mL/min (ref >60)
Glucose, Bld: 91 mg/dL (ref 65–99)
POTASSIUM: 3.5 mmol/L (ref 3.5–5.1)
SODIUM: 139 mmol/L (ref 135–145)

## 2016-06-26 LAB — CBC
HEMATOCRIT: 37.3 % (ref 36.0–46.0)
HEMOGLOBIN: 12.9 g/dL (ref 12.0–15.0)
MCH: 32 pg (ref 26.0–34.0)
MCHC: 34.6 g/dL (ref 30.0–36.0)
MCV: 92.6 fL (ref 78.0–100.0)
Platelets: 215 10*3/uL (ref 150–400)
RBC: 4.03 MIL/uL (ref 3.87–5.11)
RDW: 11.6 % (ref 11.5–15.5)
WBC: 5.5 10*3/uL (ref 4.0–10.5)

## 2016-06-26 LAB — D-DIMER, QUANTITATIVE: D-Dimer, Quant: 0.39 ug/mL-FEU (ref 0.00–0.50)

## 2016-06-26 LAB — TROPONIN I

## 2016-06-26 NOTE — Discharge Instructions (Signed)
Please call Dr. Dallas Schimkeopeland and make an appointment to follow up with him in 3-4 days. You can take 800 mg of ibuprofen every 8 hours as needed for pain. If you develop respiratory distress, worsening symptoms, fever, chills, or numbness/tingling in your arms or jaws you can return to the Emergency Department for re-evaluation.

## 2016-06-26 NOTE — Patient Instructions (Addendum)
With your recent chest pressure(present today) with dignificant dyspnea on ambulation, I do think it would be best to be evaluated in the ED since obvious cause not found by your history or exam. I do believe you would benefit from repeat ekg, imaging studies and labs. Labs would likely include 1-2 sets of troponin and possible d-dimer.   I did talk with ED Physician and notified him of patient history and presentation. Will get staff to wheel pt down since dyspnea/sob onset came on quickly on brief ambulation in office.  Follow up date to be determined after ED evaluation.

## 2016-06-26 NOTE — Telephone Encounter (Signed)
Patient Name: Willis ModenaFELICIA Wares  DOB: January 09, 1971    Initial Comment Chest pressure. Tightness.    Nurse Assessment  Nurse: Stefano GaulStringer, RN, Dwana CurdVera Date/Time (Eastern Time): 06/26/2016 8:31:00 AM  Confirm and document reason for call. If symptomatic, describe symptoms. ---Caller states she has pressure in her chest. Chest pain feels tight. She has fatigue. had stomach virus last week with vomiting and diarrhea which stopped on Saturday. Had fever last week on Tuesday but not now. Had chest pressure for 4 days. No SOB.  Does the patient have any new or worsening symptoms? ---Yes  Will a triage be completed? ---Yes  Related visit to physician within the last 2 weeks? ---No  Does the PT have any chronic conditions? (i.e. diabetes, asthma, etc.) ---No  Is the patient pregnant or possibly pregnant? (Ask all females between the ages of 6912-55) ---No  Is this a behavioral health or substance abuse call? ---No     Guidelines    Guideline Title Affirmed Question Affirmed Notes  Chest Pain Taking a deep breath makes pain worse    Final Disposition User   Go to ED Now (or PCP triage) Stefano GaulStringer, RN, Dwana CurdVera    Comments  office is open; appt scheduled for 9:14 am 06/26/2016 at 9:15 am with Esperanza RichtersEdward Saguier vs sending pt to ER  appt scheduled for 06/26/2016 at 9:15 am with Esperanza RichtersEdward Saguier   Referrals  REFERRED TO PCP OFFICE   Disagree/Comply: Danella Maiersomply

## 2016-06-26 NOTE — ED Provider Notes (Signed)
Complains of anterior nonradiating chest pain onset 5 days ago following a brief illness with vomiting and diarrhea. Vomiting and diarrhea since resolved. Her complaint presently is chest pain which is constant with dyspnea on exertion, improved with rest. No other associated symptoms. Discomfort is constant. No fever. No other associated illness. On exam no distress lungs clear auscultation heart regular rate and rhythm without murmurs abdomen nondistended nontender extremities without edema. Cardiac risk factors none. Heart score equals 2 based on EKG and age. Low pretest clinical probability for pulmonary embolism. Negative d-dimer. Chest x-ray viewed by me. Plan home observation, follow-up with PMD if not better in 3 or 4 days   Doug SouJacubowitz, Tanji Storrs, MD 06/26/16 1300

## 2016-06-26 NOTE — Progress Notes (Signed)
Subjective:    Patient ID: Evelyn Murray, female    DOB: Jun 07, 1970, 46 y.o.   MRN: [id number]  HPI   Pt in for some recent symptoms since past Tuesday. Pt states that day had migraine ha but then had stomach aches next day. On wed chills vomiting and diarrhea. 3 days of GI symptoms. Last week wed only vomited. Pt Gi symptoms started to improve.   Pt then on Friday started to notice some chest pressure. Pt states constant type symptoms. Pt has no wheezing. No leg pain. Pt has chest pressure at rest. But worse with movement. Pt states when feel more pressure in chest with activity. Some pain on deep inspiration chest and upper back. With chest pressure no shoulder pain. No arm pain. No nausea. No jaw pain.   Pt lipids cholesterol normal. Pt bp looks good today. Pt not smoker. No family history of heart disease. No diabetes. No obesity.   Pt pressure sensation was 10/10Friday night and Saturday night.   Pt she walk pressure or tightness will increase. Now at rest 4/10 pressure/tightness.    Review of Systems  HENT: Negative for congestion, ear discharge, ear pain, hearing loss, mouth sores, postnasal drip, rhinorrhea, sinus pain, sinus pressure, sneezing, tinnitus and trouble swallowing.        Pt on allergy meds but under control.  Respiratory: Positive for cough. Negative for chest tightness, shortness of breath and wheezing.   Cardiovascular: Positive for chest pain. Negative for palpitations.  Gastrointestinal: Positive for abdominal pain and diarrhea. Negative for abdominal distention, constipation, nausea and vomiting.       Last week but now resolved.  Musculoskeletal: Negative for back pain.  Skin: Negative for pallor and wound.  Neurological: Negative for seizures, facial asymmetry and weakness.  Psychiatric/Behavioral: Negative for behavioral problems, confusion and self-injury. The patient is not nervous/anxious.     Past Medical History:  Diagnosis Date  . Abnormal  Pap smear 10/1998  . Allergy   . ASCUS (atypical squamous cells of undetermined significance) on Pap smear 2006  . Chicken pox   . Chlamydia infection 02/1998   Treated with  oxacillin  . CIN I (cervical intraepithelial neoplasia I) 2011  . HPV in female 2001  . Hx of migraines   . Insomnia 2007  . LGSIL (low grade squamous intraepithelial dysplasia)   . Migraines   . UTI (lower urinary tract infection)      Social History   Social History  . Marital status: Married    Spouse name: N/A  . Number of children: N/A  . Years of education: N/A   Occupational History  . Not on file.   Social History Main Topics  . Smoking status: Never Smoker  . Smokeless tobacco: Never Used  . Alcohol use No  . Drug use: No  . Sexual activity: Not on file   Other Topics Concern  . Not on file   Social History Narrative  . No narrative on file    Past Surgical History:  Procedure Laterality Date  . CESAREAN SECTION      Family History  Problem Relation Age of Onset  . Hypertension Mother   . Hypertension Father   . Hypertension Maternal Grandfather     No Known Allergies  Current Outpatient Prescriptions on File Prior to Visit  Medication Sig Dispense Refill  . cyclobenzaprine (FLEXERIL) 10 MG tablet TAKE 1 TABLET BY MOUTH THREE TIMES DAILY AS NEEDED FOR MUSCLE SPASMS 45 tablet 1  .  fluticasone (FLONASE) 50 MCG/ACT nasal spray USE 2 SPRAYS IN EACH NOSTRIL EVERY DAY FOR STUFFY NOSE OR DRAINAGE 48 g 3  . omeprazole (PRILOSEC) 40 MG capsule TAKE 1 CAPSULE(40 MG) BY MOUTH DAILY 30 capsule 2  . SUMAtriptan (IMITREX) 50 MG tablet TAKE 1 TABLET BY MOUTH ONCE, MAY REPEAT IN 2 HOURS IF HEADACHE PERSISTS OR RECURS 10 tablet 1  . triamcinolone cream (KENALOG) 0.1 %      No current facility-administered medications on file prior to visit.     BP 133/82 (BP Location: Right Arm, Patient Position: Sitting, Cuff Size: Normal)   Pulse 86   Temp 98.2 F (36.8 C) (Oral)   Resp 16   Ht 5'  10" (1.778 m)   Wt 182 lb 12.8 oz (82.9 kg)   SpO2 100%   BMI 26.23 kg/m       Objective:   Physical Exam  General Mental Status- Alert. General Appearance- Not in acute distress.   Skin General: Color- Normal Color. Moisture- Normal Moisture.  Neck Carotid Arteries- Normal color. Moisture- Normal Moisture. No carotid bruits. No JVD.  Chest and Lung Exam Auscultation: Breath Sounds:-Normal. Clear even unlabored on exam(short walk down hall pt reports easily winded)  Cardiovascular Auscultation:Rythm- Regular. Murmurs & Other Heart Sounds:Auscultation of the heart reveals- No Murmurs.(short slow  walk down hall pulse rapidly increase to 116)  Abdomen Inspection:-Inspeection Normal. Palpation/Percussion:Note:No mass. Palpation and Percussion of the abdomen reveal- Non Tender, Non Distended + BS, no rebound or guarding.  Anterior thorax- no chest wall pain on palpation. No pain on movement of her upper ext.     Neurologic Cranial Nerve exam:- CN III-XII intact(No nystagmus), symmetric smile. Strength:- 5/5 equal and symmetric strength both upper and lower extremities.  Lower ext- no pedal edema, and negative homans signs. Calfs are symmetric.      Assessment & Plan:  ekg showed sinus rhythm.   With your recent chest pressure(present today) with dignificant dyspnea on ambulation, I do think it would be best to be evaluated in the ED since obvious cause not found by your history or exam. I do believe you would benefit from repeat ekg, imaging studies and labs. Labs would likely include 1-2 sets of troponin and possible d-dimer.   I did talk with ED Physician and notified him of patient history and presentation. Will get staff to wheel pt down since dyspnea/sob onset came on quickly on brief ambulation in office.  Follow up date to be determined after ED evaluation.

## 2016-06-26 NOTE — ED Triage Notes (Signed)
Central chest pain since Thursday, non-radiating.  Pt recently got over GI Bug.  MTW.  Pt states some sob with exertion.  Some weakness.

## 2016-06-26 NOTE — Progress Notes (Signed)
Pre visit review using our clinic review tool, if applicable. No additional management support is needed unless otherwise documented below in the visit note. 

## 2016-06-26 NOTE — ED Provider Notes (Signed)
[facility] Provider Note   CSN: [id number] Arrival date & time: 06/26/16  1041     History   Chief Complaint Chief Complaint  Patient presents with  . Chest Pain    HPI Evelyn Murray is a 46 y.o. female with a h/o of GERD and seasonal allergies who presents to the emergency department with dyspnea on exertion for 4 days. She was sent to the ED from her primary care provider's office after she became increasingly tachycardic after walking up a flight of stairs.   She reports that she was ill with a stomach bug 7 days ago that completed resolved 5 days ago. The next day she became short of breath. She also reports associated constant, pressure-like non-radiating chest pain. She states that she initially thought the chest pain was heartburn. She has been compliant with her omeprazole, but the pain has not resolved. She reports the pressure is worse with deep breaths and improved with rest. No cough, sore throat, fever, or chills. She also reports bilateral pain near the base of her shoulder blades and reports that the pain is worse on her right-side when she lays on her left.   PMH includes migraines, GERD, and season allergies. Daily medications include omeprazole, claritin, and zyrtec.   The history is provided by the patient. No language interpreter was used.    Past Medical History:  Diagnosis Date  . Abnormal Pap smear 10/1998  . Allergy   . ASCUS (atypical squamous cells of undetermined significance) on Pap smear 2006  . Chicken pox   . Chlamydia infection 02/1998   Treated with  oxacillin  . CIN I (cervical intraepithelial neoplasia I) 2011  . HPV in female 2001  . Hx of migraines   . Insomnia 2007  . LGSIL (low grade squamous intraepithelial dysplasia)   . Migraines   . UTI (lower urinary tract infection)     Patient Active Problem List   Diagnosis Date Noted  . Migraine 12/05/2013  . Insomnia secondary to anxiety 11/07/2013  . Hip flexor tendinitis  07/28/2013  . 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 Routine general medical examination at a health care facility 11/05/2012  . Dysplasia of cervix, low grade (CIN 1) 09/06/2011  . Asthma with allergic rhinitis 08/20/2011  . Unspecified general medical examination 08/17/2011  . Pinched nerve 02/16/2011  . Low back pain 02/16/2011  . Insomnia 02/16/2011    Past Surgical History:  Procedure Laterality Date  . CESAREAN SECTION      OB History    Gravida Para Term Preterm AB Living   1 1   1   1    SAB TAB Ectopic Multiple Live Births           1       Home Medications    Prior to Admission medications   Medication Sig Start Date End Date Taking? Authorizing Provider  cyclobenzaprine (FLEXERIL) 10 MG tablet TAKE 1 TABLET BY MOUTH THREE TIMES DAILY AS NEEDED FOR MUSCLE SPASMS 01/19/16   Copland, Gwenlyn Found, MD  fluticasone (FLONASE) 50 MCG/ACT nasal spray USE 2 SPRAYS IN EACH NOSTRIL EVERY DAY FOR STUFFY NOSE OR DRAINAGE 10/06/15   Sheliah Hatch, MD  omeprazole (PRILOSEC) 40 MG capsule TAKE 1 CAPSULE(40 MG) BY MOUTH DAILY 04/22/16   Copland, Gwenlyn Found, MD  SUMAtriptan (IMITREX) 50 MG tablet TAKE 1 TABLET BY MOUTH ONCE, MAY REPEAT IN 2 HOURS IF HEADACHE PERSISTS OR RECURS 02/18/16  Copland, Gwenlyn Found, MD  triamcinolone cream (KENALOG) 0.1 %  08/01/11   [provider]    Family History Family History  Problem Relation Age of Onset  . Hypertension Mother   . Hypertension Father   . Hypertension Maternal Grandfather     Social History Social History  Substance Use Topics  . Smoking status: Never Smoker  . Smokeless tobacco: Never Used  . Alcohol use No     Allergies   Patient has no known allergies.   Review of Systems Review of Systems  Constitutional: Negative for activity change, chills and fever.  HENT: Negative for sore throat.   Eyes: Negative for visual disturbance.  Respiratory: Positive for shortness of  breath. Negative for cough.   Cardiovascular: Positive for chest pain.  Gastrointestinal: Positive for diarrhea (resolved) and vomiting (resolved). Negative for abdominal pain.  Genitourinary: Negative for flank pain.  Musculoskeletal: Negative for back pain.  Skin: Negative for rash.  Allergic/Immunologic: Negative for immunocompromised state.  Neurological: Negative for dizziness, light-headedness and headaches.  Psychiatric/Behavioral: Negative for confusion.   Physical Exam Updated Vital Signs BP 120/78 (BP Location: Left Arm)   Pulse 77   Resp 17   SpO2 100%   Physical Exam  Constitutional: She appears well-developed and well-nourished. No distress.  HENT:  Head: Normocephalic and atraumatic.  Eyes: Conjunctivae are normal.  Neck: Neck supple.  Cardiovascular: Normal rate, regular rhythm, normal heart sounds and intact distal pulses.  Exam reveals no gallop and no friction rub.   No murmur heard. Pulmonary/Chest: Effort normal and breath sounds normal. No respiratory distress. She has no wheezes. She has no rales. She exhibits no tenderness.  Abdominal: Soft. Bowel sounds are normal. She exhibits no distension and no mass. There is no tenderness. There is no rebound and no guarding.  Musculoskeletal: She exhibits no edema.  Neurological: She is alert.  Skin: Skin is warm and dry. No rash noted. She is not diaphoretic.  Psychiatric: Her behavior is normal.  Nursing note and vitals reviewed.    ED Treatments / Results  Labs (all labs ordered are listed, but only abnormal results are displayed) Labs Reviewed  TROPONIN I  CBC  D-DIMER, QUANTITATIVE (NOT AT [facility])  BASIC METABOLIC PANEL    EKG  EKG Interpretation  Date/Time:  Monday Jun 26 2016 10:54:59 EDT Ventricular Rate:  79 PR Interval:    QRS Duration: 78 QT Interval:  365 QTC Calculation: 419 R Axis:   44 Text Interpretation:  Sinus rhythm Low voltage, precordial leads Borderline T abnormalities, diffuse  leads Baseline wander in lead(s) V3 Confirmed by Ethelda Chick  MD, SAM ([id number]) on 06/26/2016 11:09:21 AM       Radiology No results found.  Procedures Procedures (including critical care time)  Medications Ordered in ED Medications - No data to display   Initial Impression / Assessment and Plan / ED Course  I have reviewed the triage vital signs and the nursing notes.  Pertinent labs & imaging results that were available during my care of the patient were reviewed by me and considered in my medical decision making (see chart for details).     Patient is to be discharged with recommendation to follow up with PCP in regards to today's hospital visit if she is not feeling better in the next 3-4 days. HEART score = 2. Chest pain is not likely of cardiac or pulmonary etiology d/t presentation, PERC negative, VSS, no tracheal deviation, no JVD or new murmur, RRR, breath sounds  equal bilaterally, EKG without acute abnormalities, negative troponin, and negative CXR. Low pretest probability for PE; D-dimer negative. Pt has been advised to return to the ED if CP becomes exertional, associated with diaphoresis or nausea, radiates to left jaw/arm, worsens or becomes concerning in any way. Pt appears reliable for follow up and is agreeable to discharge.   Case has been discussed with and seen by Dr. Ethelda ChickJacubowitz who agrees with the above plan to discharge.   Final Clinical Impressions(s) / ED Diagnoses   Final diagnoses:  Dyspnea on exertion  Atypical chest pain    New Prescriptions Discharge Medication List as of 06/26/2016  1:10 PM       Ayden Hardwick A, PA-C 06/30/16 [phone]    Doug SouJacubowitz, Sam, MD 06/30/16 2013

## 2016-10-23 ENCOUNTER — Other Ambulatory Visit: Payer: Self-pay | Admitting: Family Medicine

## 2016-12-08 DIAGNOSIS — Z23 Encounter for immunization: Secondary | ICD-10-CM | POA: Diagnosis not present

## 2017-01-02 DIAGNOSIS — Z124 Encounter for screening for malignant neoplasm of cervix: Secondary | ICD-10-CM | POA: Diagnosis not present

## 2017-01-02 DIAGNOSIS — Z01419 Encounter for gynecological examination (general) (routine) without abnormal findings: Secondary | ICD-10-CM | POA: Diagnosis not present

## 2017-01-02 LAB — HM PAP SMEAR

## 2017-01-09 ENCOUNTER — Encounter: Payer: Self-pay | Admitting: General Practice

## 2017-07-25 ENCOUNTER — Other Ambulatory Visit: Payer: Self-pay | Admitting: Family Medicine

## 2017-09-10 ENCOUNTER — Other Ambulatory Visit (HOSPITAL_BASED_OUTPATIENT_CLINIC_OR_DEPARTMENT_OTHER): Payer: Self-pay | Admitting: Obstetrics and Gynecology

## 2017-09-10 DIAGNOSIS — Z1231 Encounter for screening mammogram for malignant neoplasm of breast: Secondary | ICD-10-CM

## 2017-09-12 ENCOUNTER — Ambulatory Visit (HOSPITAL_BASED_OUTPATIENT_CLINIC_OR_DEPARTMENT_OTHER)
Admission: RE | Admit: 2017-09-12 | Discharge: 2017-09-12 | Disposition: A | Discharge status: Home / Self Care | Payer: 59 | Source: Ambulatory Visit | Attending: Obstetrics and Gynecology | Admitting: Obstetrics and Gynecology

## 2017-09-12 DIAGNOSIS — Z1231 Encounter for screening mammogram for malignant neoplasm of breast: Secondary | ICD-10-CM | POA: Insufficient documentation

## 2017-11-09 ENCOUNTER — Other Ambulatory Visit: Payer: Self-pay | Admitting: Family Medicine

## 2017-12-08 ENCOUNTER — Other Ambulatory Visit: Payer: Self-pay | Admitting: Family Medicine

## 2018-01-10 DIAGNOSIS — Z01419 Encounter for gynecological examination (general) (routine) without abnormal findings: Secondary | ICD-10-CM | POA: Diagnosis not present

## 2018-01-10 DIAGNOSIS — Z6826 Body mass index (BMI) 26.0-26.9, adult: Secondary | ICD-10-CM | POA: Diagnosis not present

## 2018-01-10 DIAGNOSIS — Z124 Encounter for screening for malignant neoplasm of cervix: Secondary | ICD-10-CM | POA: Diagnosis not present

## 2018-01-10 DIAGNOSIS — R87612 Low grade squamous intraepithelial lesion on cytologic smear of cervix (LGSIL): Secondary | ICD-10-CM | POA: Diagnosis not present

## 2018-06-04 ENCOUNTER — Other Ambulatory Visit: Payer: Self-pay | Admitting: Family Medicine

## 2018-10-19 NOTE — Progress Notes (Addendum)
[facility] [place], [place], [place] [address] [phone]  Date:  2/42/3536   Name:  Evelyn Murray   DOB:  02-Dec-1970   MRN:  [id number]  PCP:  Darreld Mclean, MD    Chief Complaint: Joint pain (right leg pain, right arm, numbness in feet, feels like pin pricks, worse at night whole body aches)   History of Present Illness:  Evelyn Murray is a 48 y.o. very pleasant female patient who presents with the following:  Patient with history of migraine headache, cervical disc disorder, low back pain, insomnia and anxiety, asthma Here today with concern of various joint pains and body pains Saw her in October 2017 Her GYN is Dr. Cletis Media  Per HPI from October 2017: She states that she still has "pain down the back of my neck, through my shoulder blade and down my right arm" [facility]- she had an MRI of her brain about 5 days ago. This was normal   Per neurology she is now taking baclofen prn and zonisamide - she is up to 100 mg of zonisamide now.  She feels that her pain is somewhat reduced but not gone. She is frustrated by lack of answers and suspect that her problem may be more in her neck than in her head.  She did see neurosurgery about 2 years ago for a similar problem She had an ablation a few years ago  We did do an MRI of her brain at that time, it was normal She notes that her sx seemed to resolve for a time, returned 3-4 months ago   She notes that she has chronic back pain - "it is off and on consistently" Right hip pain-this also has come and gone for several years.  She has been told she had tendinitis or bursitis in the past.  She denies any physically strenuous activity About 3 weeks ago her right elbow was hot and swollen for no reason that she is aware  "Some mornings I wake up and my whole body aches, but I did not fall or hurt myself" No aware of  any family history of any autoimmune disorder A cousin does have gout  No fever No rashes No fevers Her LMP was about a year ago following her ablation She did have an ablation, cesarean section  No other operations   She takes prn imitrex and flexeril for migraine headache Notes that her HA are actually better recently She has history of reflux and uses otc prilosec   No recent labs done  She is taking ibuprofen prn   Flu shot today   We discussed colon cancer screening- She would like to do cologuard  She is NOT fasting at this time    Patient Active Problem List   Diagnosis Date Noted  . Migraine 12/05/2013  . Insomnia secondary to anxiety 11/07/2013  . Hip flexor tendinitis 07/28/2013  . 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 Dysplasia of cervix, low grade (CIN 1) 09/06/2011  . Asthma with allergic rhinitis 08/20/2011  . Pinched nerve 02/16/2011  . Low back pain 02/16/2011  . Insomnia 02/16/2011    Past Medical History:  Diagnosis Date  . Abnormal Pap smear 10/1998  . Allergy   . ASCUS (atypical squamous cells of undetermined significance) on Pap smear 2006  . Chicken pox   .  Chlamydia infection 02/1998   Treated with  oxacillin  . CIN I (cervical intraepithelial neoplasia I) 2011  . HPV in female 2001  . Hx of migraines   . Insomnia 2007  . LGSIL (low grade squamous intraepithelial dysplasia)   . Migraines   . UTI (lower urinary tract infection)     Past Surgical History:  Procedure Laterality Date  . CESAREAN SECTION      Social History   Tobacco Use  . Smoking status: Never Smoker  . Smokeless tobacco: Never Used  Substance Use Topics  . Alcohol use: No  . Drug use: No    Family History  Problem Relation Age of Onset  . Hypertension Mother   . Hypertension Father   . Hypertension Maternal Grandfather     No Known Allergies  Medication list has been reviewed and updated.  Current Outpatient  Medications on File Prior to Visit  Medication Sig Dispense Refill  . cyclobenzaprine (FLEXERIL) 10 MG tablet TAKE 1 TABLET BY MOUTH THREE TIMES DAILY AS NEEDED FOR MUSCLE SPASMS 90 tablet 0  . SUMAtriptan (IMITREX) 50 MG tablet TAKE 1 TABLET BY MOUTH ONCE AS NEEDED FOR MIGRAINE, THEN MAY REPEAT IN 2 HOURS IF HEADACHE PERSISTS OR RECURS 10 tablet 0   No current facility-administered medications on file prior to visit.     Review of Systems:  As per HPI- otherwise negative.   Physical Examination: Vitals:   10/21/18 1426  BP: 126/80  Pulse: (!) 104  Resp: 16  Temp: (!) 97.1 F (36.2 C)  SpO2: 96%   Vitals:   10/21/18 1426  Weight: 202 lb (91.6 kg)  Height: 5\' 10"  (1.778 m)   Body mass index is 28.98 kg/m. Ideal Body Weight: Weight in (lb) to have BMI = 25: 173.9  GEN: WDWN, NAD, Non-toxic, A & O x 3, mild overweight, looks well  HEENT: Atraumatic, Normocephalic. Neck supple. No masses, No LAD. Ears and Nose: No external deformity. CV: RRR, No M/G/R. No JVD. No thrill. No extra heart sounds. PULM: CTA B, no wheezes, crackles, rhonchi. No retractions. No resp. distress. No accessory muscle use. ABD: S, NT, ND, +BS. No rebound. No HSM. EXTR: No c/c/e NEURO Normal gait.  PSYCH: Normally interactive. Conversant. Not depressed or anxious appearing.  Calm demeanor.  No joint abnormalities are apparent on exam.  Hands appear normal, right elbow is now normal.  Normal strength, sensation of all limbs   Assessment and Plan: Arthralgia, unspecified joint - Plan: Sedimentation rate, C-reactive protein, Rheumatoid Factor, ANA, Cyclic citrul peptide antibody, IgG (QUEST), Ambulatory referral to Rheumatology  Right hip pain - Plan: DG HIP UNILAT W OR W/O PELVIS 2-3 VIEWS RIGHT  Elbow swelling, right - Plan: Uric acid, Ambulatory referral to Rheumatology  Screening for deficiency anemia - Plan: CBC  Muscle ache - Plan: CK  Screening for hyperlipidemia - Plan: Lipid  panel  Screening for thyroid disorder - Plan: TSH  Screening for colon cancer  Screening for diabetes mellitus - Plan: Comprehensive metabolic panel, Hemoglobin A1c  Need for immunization against influenza - Plan: Flu Vaccine QUAD 6+ mos PF IM (Fluarix Quad PF)  Chronic midline low back pain without sciatica - Plan: DG Lumbar Spine Complete  ANA positive - Plan: Ambulatory referral to Rheumatology  Here today with concern of various joint and body pains.  She describes swelling and redness of her right elbow about 3 weeks ago, now resolved She is also bothered by chronic lower back and right hip  pain She may have an autoimmune disorder, or some other rheumatologic issue.  We will start work-up with labs and films as above Signed Abbe Amsterdam, MD  9/1- received the following x-ray reports and labs so far;called pt.  She has noted chronic back pain.  No IV drug use, no spine surgery or injection except for epidural with childbirth D/w radiology- they would recommend MRI with contrast  Pt is aware, she is calling her insurance to ask about cost and will let me know how she wishes to proceed   Dg Lumbar Spine Complete  Addendum Date: 10/22/2018   ADDENDUM REPORT: 10/22/2018 12:38 ADDENDUM: The findings are discussed with Dr. Patsy Lager. Electronically Signed   By: Norva Pavlov M.D.   On: 10/22/2018 12:38   Result Date: 10/22/2018 CLINICAL DATA:  C/o low back pain x 30 years, recently pain has started radiating down right hip NKI EXAM: LUMBAR SPINE - COMPLETE 4+ VIEW COMPARISON:  07/22/2009 FINDINGS: There are moderate degenerative changes in the lumbar spine. There is normal alignment. The superior endplate of L5 and the inferior endplate of L4 appear regular. Considerations include degenerative change or discitis. Uncovertebral spurring identified in the LOWER thoracic levels. There is facet hypertrophy in the LOWER lumbar spine. No suspicious lytic or blastic lesions are identified.  Bilateral Essure devices identified in the central pelvis. IMPRESSION: 1. Irregular endplate changes at disc space L4-5, raising the question of discitis. 2. Consider further evaluation with MRI. 3. These results will be called to the ordering clinician or representative by the Radiologist Assistant, and communication documented in the PACS or zVision Dashboard. Electronically Signed: By: Norva Pavlov M.D. On: 10/21/2018 15:21   Mr Lumbar Spine Wo Contrast  Result Date: 11/02/2018 CLINICAL DATA:  Possible lumbar discitis on preceding plain films EXAM: MRI LUMBAR SPINE WITHOUT CONTRAST TECHNIQUE: Multiplanar, multisequence MR imaging of the lumbar spine was performed. No intravenous contrast was administered. COMPARISON:  Radiography 10/21/2018 FINDINGS: Segmentation:  5 lumbar type vertebrae Alignment:  Normal Vertebrae: At the L4-5 level highlighted on prior there is mild endplate edema but primarily fatty marrow conversion about degenerative disc narrowing with ridging. Minimal T2 hyperintensity of the disc but most of the disc has normal signal (allowing for desiccation). No paravertebral inflammation is seen. Findings are most consistent with degenerative process. Negative for fracture or bone lesion. Conus medullaris and cauda equina: Conus extends to the L1-2 level. Conus and cauda equina appear normal. Paraspinal and other soft tissues: Negative Disc levels: T12- L1: Spondylosis. L1-L2: Minor disc bulging L2-L3: Minor disc bulging L3-L4: Mild endplate degeneration and disc narrowing with bulge. L4-L5: Moderate disc narrowing mainly anteriorly. There is moderate rightward far-lateral spurring. No impingement. L5-S1:Mild right facet spurring.  No impingement. IMPRESSION: 1. Findings at the previously highlighted L4-5 level are consistent with moderate disc degeneration. 2. Milder degenerative changes are seen at the other levels, as above. 3. No impingement to explain history of leg symptoms.  Electronically Signed   By: Marnee Spring M.D.   On: 11/02/2018 13:54   Dg Hip Unilat W Or W/o Pelvis 2-3 Views Right  Result Date: 10/21/2018 CLINICAL DATA:  C/o low back pain x 30 years, recently pain has started radiating down right hip NKI EXAM: DG HIP (WITH OR WITHOUT PELVIS) 2-3V RIGHT COMPARISON:  None. FINDINGS: There is no evidence of hip fracture or dislocation. There is no evidence of arthropathy or other focal bone abnormality. Essure device is identified within the central pelvis. IMPRESSION: Negative. Electronically Signed  By: Norva PavlovElizabeth  Brown M.D.   On: 10/21/2018 15:18   Results for orders placed or performed in visit on 10/21/18  CBC  Result Value Ref Range   WBC 7.5 4.0 - 10.5 K/uL   RBC 4.09 3.87 - 5.11 Mil/uL   Platelets 223.0 150.0 - 400.0 K/uL   Hemoglobin 12.6 12.0 - 15.0 g/dL   HCT 16.137.7 09.636.0 - 04.546.0 %   MCV 92.3 78.0 - 100.0 fl   MCHC 33.5 30.0 - 36.0 g/dL   RDW 40.912.8 81.111.5 - 91.415.5 %  Comprehensive metabolic panel  Result Value Ref Range   Sodium 142 135 - 145 mEq/L   Potassium 4.1 3.5 - 5.1 mEq/L   Chloride 104 96 - 112 mEq/L   CO2 29 19 - 32 mEq/L   Glucose, Bld 88 70 - 99 mg/dL   BUN 10 6 - 23 mg/dL   Creatinine, Ser 7.820.78 0.40 - 1.20 mg/dL   Total Bilirubin 0.3 0.2 - 1.2 mg/dL   Alkaline Phosphatase 67 39 - 117 U/L   AST 16 0 - 37 U/L   ALT 17 0 - 35 U/L   Total Protein 7.3 6.0 - 8.3 g/dL   Albumin 4.3 3.5 - 5.2 g/dL   Calcium 9.6 8.4 - 95.610.5 mg/dL   GFR 21.3094.54 >86.57>60.00 mL/min  Hemoglobin A1c  Result Value Ref Range   Hgb A1c MFr Bld 5.8 4.6 - 6.5 %  Lipid panel  Result Value Ref Range   Cholesterol 143 0 - 200 mg/dL   Triglycerides 846.9123.0 0.0 - 149.0 mg/dL   HDL 62.9539.00 (L) >28.41>39.00 mg/dL   VLDL 32.424.6 0.0 - 40.140.0 mg/dL   LDL Cholesterol 79 0 - 99 mg/dL   Total CHOL/HDL Ratio 4    NonHDL 103.99   Sedimentation rate  Result Value Ref Range   Sed Rate 12 0 - 30 mm/hr  C-reactive protein  Result Value Ref Range   CRP 1.2 0.5 - 20.0 mg/dL  Rheumatoid  Factor  Result Value Ref Range   Rhuematoid fact SerPl-aCnc <14 <14 IU/mL  ANA  Result Value Ref Range   Anti Nuclear Antibody (ANA) POSITIVE (A) NEGATIVE  Cyclic citrul peptide antibody, IgG (QUEST)  Result Value Ref Range   Cyclic Citrullin Peptide Ab <02<16 UNITS  CK  Result Value Ref Range   Total CK 177 7 - 177 U/L  TSH  Result Value Ref Range   TSH 3.42 0.35 - 4.50 uIU/mL  Uric acid  Result Value Ref Range   Uric Acid, Serum 4.0 2.4 - 7.0 mg/dL  Anti-nuclear ab-titer (ANA titer)  Result Value Ref Range   ANA Titer 1 1:80 (H) titer   ANA Pattern 1 Nuclear, Speckled (A)    Called pt- she is having her MRI tomorrow Will order rheum referral for her   Received her MRI and called on 9/13- no evidence of discitis  She does have degenerative changes in her spine Proceed with rheum referral as planned   CLINICAL DATA:  Possible lumbar discitis on preceding plain films  EXAM: MRI LUMBAR SPINE WITHOUT CONTRAST  TECHNIQUE: Multiplanar, multisequence MR imaging of the lumbar spine was performed. No intravenous contrast was administered.  COMPARISON:  Radiography 10/21/2018  FINDINGS: Segmentation:  5 lumbar type vertebrae  Alignment:  Normal  Vertebrae: At the L4-5 level highlighted on prior there is mild endplate edema but primarily fatty marrow conversion about degenerative disc narrowing with ridging. Minimal T2 hyperintensity of the disc but most of the disc has  normal signal (allowing for desiccation). No paravertebral inflammation is seen. Findings are most consistent with degenerative process. Negative for fracture or bone lesion.  Conus medullaris and cauda equina: Conus extends to the L1-2 level. Conus and cauda equina appear normal.  Paraspinal and other soft tissues: Negative  Disc levels:  T12- L1: Spondylosis.  L1-L2: Minor disc bulging  L2-L3: Minor disc bulging  L3-L4: Mild endplate degeneration and disc narrowing with  bulge.  L4-L5: Moderate disc narrowing mainly anteriorly. There is moderate rightward far-lateral spurring. No impingement.  L5-S1:Mild right facet spurring.  No impingement.  IMPRESSION: 1. Findings at the previously highlighted L4-5 level are consistent with moderate disc degeneration. 2. Milder degenerative changes are seen at the other levels, as above. 3. No impingement to explain history of leg symptoms.

## 2018-10-21 ENCOUNTER — Ambulatory Visit: Payer: 59 | Admitting: Family Medicine

## 2018-10-21 ENCOUNTER — Other Ambulatory Visit: Payer: Self-pay

## 2018-10-21 ENCOUNTER — Encounter: Payer: Self-pay | Admitting: Family Medicine

## 2018-10-21 ENCOUNTER — Ambulatory Visit (HOSPITAL_BASED_OUTPATIENT_CLINIC_OR_DEPARTMENT_OTHER)
Admission: RE | Admit: 2018-10-21 | Discharge: 2018-10-21 | Disposition: A | Discharge status: Home / Self Care | Payer: 59 | Source: Ambulatory Visit | Attending: Family Medicine | Admitting: Family Medicine

## 2018-10-21 VITALS — BP 126/80 | HR 104 | Temp 97.1°F | Resp 16 | Ht 70.0 in | Wt 202.0 lb

## 2018-10-21 DIAGNOSIS — M791 Myalgia, unspecified site: Secondary | ICD-10-CM

## 2018-10-21 DIAGNOSIS — G8929 Other chronic pain: Secondary | ICD-10-CM

## 2018-10-21 DIAGNOSIS — M545 Other chronic pain: Secondary | ICD-10-CM

## 2018-10-21 DIAGNOSIS — Z23 Encounter for immunization: Secondary | ICD-10-CM

## 2018-10-21 DIAGNOSIS — M2550 Pain in unspecified joint: Secondary | ICD-10-CM

## 2018-10-21 DIAGNOSIS — Z130 Encounter for screening for diseases of the blood and blood-forming organs and certain disorders involving the immune mechanism: Secondary | ICD-10-CM | POA: Diagnosis not present

## 2018-10-21 DIAGNOSIS — Z131 Encounter for screening for diabetes mellitus: Secondary | ICD-10-CM

## 2018-10-21 DIAGNOSIS — Z1211 Encounter for screening for malignant neoplasm of colon: Secondary | ICD-10-CM

## 2018-10-21 DIAGNOSIS — Z1329 Encounter for screening for other suspected endocrine disorder: Secondary | ICD-10-CM

## 2018-10-21 DIAGNOSIS — M25551 Pain in right hip: Secondary | ICD-10-CM

## 2018-10-21 DIAGNOSIS — R768 Other specified abnormal immunological findings in serum: Secondary | ICD-10-CM

## 2018-10-21 DIAGNOSIS — M25421 Effusion, right elbow: Secondary | ICD-10-CM

## 2018-10-21 DIAGNOSIS — Z13220 Encounter for screening for lipoid disorders: Secondary | ICD-10-CM

## 2018-10-21 DIAGNOSIS — R7689 Other specified abnormal immunological findings in serum: Secondary | ICD-10-CM

## 2018-10-21 NOTE — Patient Instructions (Addendum)
Good to see you again today Please go to the lab, and then to the ground floor to have an x-ray of your hip I will be in touch with your labs, we will see if we find anything to explain your symptoms Will also order cologuard to screen for colon cancer   Let me know if anything changes in the meantime

## 2018-10-22 LAB — COMPREHENSIVE METABOLIC PANEL
ALT: 17 U/L (ref 0–35)
AST: 16 U/L (ref 0–37)
Albumin: 4.3 g/dL (ref 3.5–5.2)
Alkaline Phosphatase: 67 U/L (ref 39–117)
BUN: 10 mg/dL (ref 6–23)
CO2: 29 mEq/L (ref 19–32)
Calcium: 9.6 mg/dL (ref 8.4–10.5)
Chloride: 104 mEq/L (ref 96–112)
Creatinine, Ser: 0.78 mg/dL (ref 0.40–1.20)
GFR: 94.54 mL/min (ref >60.00)
Glucose, Bld: 88 mg/dL (ref 70–99)
Potassium: 4.1 mEq/L (ref 3.5–5.1)
Sodium: 142 mEq/L (ref 135–145)
Total Bilirubin: 0.3 mg/dL (ref 0.2–1.2)
Total Protein: 7.3 g/dL (ref 6.0–8.3)

## 2018-10-22 LAB — LIPID PANEL
Cholesterol: 143 mg/dL (ref 0–200)
HDL: 39 mg/dL — ABNORMAL LOW (ref >39.00)
LDL Cholesterol: 79 mg/dL (ref 0–99)
NonHDL: 103.99
Total CHOL/HDL Ratio: 4
Triglycerides: 123 mg/dL (ref 0.0–149.0)
VLDL: 24.6 mg/dL (ref 0.0–40.0)

## 2018-10-22 LAB — HEMOGLOBIN A1C: Hgb A1c MFr Bld: 5.8 % (ref 4.6–6.5)

## 2018-10-22 LAB — CBC
HCT: 37.7 % (ref 36.0–46.0)
Hemoglobin: 12.6 g/dL (ref 12.0–15.0)
MCHC: 33.5 g/dL (ref 30.0–36.0)
MCV: 92.3 fl (ref 78.0–100.0)
Platelets: 223 10*3/uL (ref 150.0–400.0)
RBC: 4.09 Mil/uL (ref 3.87–5.11)
RDW: 12.8 % (ref 11.5–15.5)
WBC: 7.5 10*3/uL (ref 4.0–10.5)

## 2018-10-22 LAB — SEDIMENTATION RATE: Sed Rate: 12 mm/hr (ref 0–30)

## 2018-10-22 LAB — URIC ACID: Uric Acid, Serum: 4 mg/dL (ref 2.4–7.0)

## 2018-10-22 LAB — TSH: TSH: 3.42 u[IU]/mL (ref 0.35–4.50)

## 2018-10-22 LAB — CK: Total CK: 177 U/L (ref 7–177)

## 2018-10-22 LAB — C-REACTIVE PROTEIN: CRP: 1.2 mg/dL (ref 0.5–20.0)

## 2018-10-23 ENCOUNTER — Telehealth: Payer: Self-pay

## 2018-10-23 ENCOUNTER — Telehealth: Payer: Self-pay | Admitting: Family Medicine

## 2018-10-23 DIAGNOSIS — G8911 Acute pain due to trauma: Secondary | ICD-10-CM

## 2018-10-23 LAB — ANTI-NUCLEAR AB-TITER (ANA TITER): ANA Titer 1: 1:80 {titer} — ABNORMAL HIGH

## 2018-10-23 LAB — RHEUMATOID FACTOR: Rhuematoid fact SerPl-aCnc: <14 IU/mL (ref <14)

## 2018-10-23 LAB — ANA: Anti Nuclear Antibody (ANA): POSITIVE — AB

## 2018-10-23 LAB — CYCLIC CITRUL PEPTIDE ANTIBODY, IGG: Cyclic Citrullin Peptide Ab: <16 UNITS

## 2018-10-23 NOTE — Telephone Encounter (Signed)
Called her back to discuss She would like to do the lumbar MRI for concern of discitis I will order now  She declines sedative No implanted devices   Dg Lumbar Spine Complete  Addendum Date: 10/22/2018   ADDENDUM REPORT: 10/22/2018 12:38 ADDENDUM: The findings are discussed with Dr. Lorelei Pont. Electronically Signed   By: Nolon Nations M.D.   On: 10/22/2018 12:38   Result Date: 10/22/2018 CLINICAL DATA:  C/o low back pain x 30 years, recently pain has started radiating down right hip NKI EXAM: LUMBAR SPINE - COMPLETE 4+ VIEW COMPARISON:  07/22/2009 FINDINGS: There are moderate degenerative changes in the lumbar spine. There is normal alignment. The superior endplate of L5 and the inferior endplate of L4 appear regular. Considerations include degenerative change or discitis. Uncovertebral spurring identified in the LOWER thoracic levels. There is facet hypertrophy in the LOWER lumbar spine. No suspicious lytic or blastic lesions are identified. Bilateral Essure devices identified in the central pelvis. IMPRESSION: 1. Irregular endplate changes at disc space L4-5, raising the question of discitis. 2. Consider further evaluation with MRI. 3. These results will be called to the ordering clinician or representative by the Radiologist Assistant, and communication documented in the PACS or zVision Dashboard. Electronically Signed: By: Nolon Nations M.D. On: 10/21/2018 15:21   Dg Hip Unilat W Or W/o Pelvis 2-3 Views Right  Result Date: 10/21/2018 CLINICAL DATA:  C/o low back pain x 30 years, recently pain has started radiating down right hip NKI EXAM: DG HIP (WITH OR WITHOUT PELVIS) 2-3V RIGHT COMPARISON:  None. FINDINGS: There is no evidence of hip fracture or dislocation. There is no evidence of arthropathy or other focal bone abnormality. Essure device is identified within the central pelvis. IMPRESSION: Negative. Electronically Signed   By: Nolon Nations M.D.   On: 10/21/2018 15:18

## 2018-10-23 NOTE — Telephone Encounter (Signed)
Copied from [place] [id number]. Topic: General - Inquiry >> Oct 22, 2018  1:36 PM Mathis Bud wrote: Reason for CRM: Patient is returning PCP call due to lab results. Patient call back [phone]

## 2018-10-23 NOTE — Telephone Encounter (Signed)
Patient is returning call about her MRI. Would like a call back please

## 2018-10-23 NOTE — Addendum Note (Signed)
Addended by: Lamar Blinks C on: 10/23/2018 04:34 PM   Modules accepted: Orders

## 2018-10-24 NOTE — Telephone Encounter (Signed)
Working on Freight [employer]

## 2018-10-30 ENCOUNTER — Other Ambulatory Visit: Payer: Self-pay | Admitting: Family Medicine

## 2018-11-01 NOTE — Addendum Note (Signed)
Addended by: Lamar Blinks C on: 11/01/2018 06:26 PM   Modules accepted: Orders

## 2018-11-02 ENCOUNTER — Ambulatory Visit
Admission: RE | Admit: 2018-11-02 | Discharge: 2018-11-02 | Disposition: A | Discharge status: Home / Self Care | Payer: 59 | Source: Ambulatory Visit | Attending: Family Medicine | Admitting: Family Medicine

## 2018-11-02 ENCOUNTER — Other Ambulatory Visit: Payer: Self-pay

## 2018-11-02 DIAGNOSIS — G8911 Acute pain due to trauma: Secondary | ICD-10-CM

## 2018-11-10 ENCOUNTER — Other Ambulatory Visit: Payer: Self-pay | Admitting: Family Medicine

## 2018-12-02 ENCOUNTER — Telehealth: Payer: Self-pay | Admitting: *Deleted

## 2018-12-02 DIAGNOSIS — Z20822 Contact with and (suspected) exposure to covid-19: Secondary | ICD-10-CM

## 2018-12-02 DIAGNOSIS — Z20828 Contact with and (suspected) exposure to other viral communicable diseases: Secondary | ICD-10-CM

## 2018-12-02 NOTE — Telephone Encounter (Signed)
Virtual visit or just advise patient to go to testing sight as they do not require an order?

## 2018-12-02 NOTE — Telephone Encounter (Signed)
Copied from [place] [phone]. Topic: General - Other >> Dec 02, 2018  8:51 AM Pauline Good wrote: Reason for CRM: pt has been exposed to covid and want to know what does she need to do

## 2018-12-02 NOTE — Telephone Encounter (Signed)
Please call her back- I have ordered covid test for her. Please explain testing procedure.  If she is ill let's schedule a virtual visit JC

## 2018-12-03 ENCOUNTER — Other Ambulatory Visit: Payer: Self-pay

## 2018-12-03 ENCOUNTER — Ambulatory Visit: Payer: Self-pay | Admitting: *Deleted

## 2018-12-03 DIAGNOSIS — Z20822 Contact with and (suspected) exposure to covid-19: Secondary | ICD-10-CM

## 2018-12-03 NOTE — Telephone Encounter (Signed)
Patient explained procedure-declines visit for now.

## 2018-12-03 NOTE — Telephone Encounter (Signed)
Pt. Called back. Given Dr. Lillie Fragmin recommendation to be tested for COVID 19. Will go to [facility] location. Reports she does not have symptoms at this time. Will hold off on a visit for now.

## 2018-12-05 LAB — NOVEL CORONAVIRUS, NAA: SARS-CoV-2, NAA: NOT DETECTED

## 2018-12-13 ENCOUNTER — Other Ambulatory Visit (HOSPITAL_BASED_OUTPATIENT_CLINIC_OR_DEPARTMENT_OTHER): Payer: Self-pay | Admitting: Family Medicine

## 2018-12-13 ENCOUNTER — Other Ambulatory Visit (HOSPITAL_BASED_OUTPATIENT_CLINIC_OR_DEPARTMENT_OTHER): Payer: Self-pay | Admitting: Obstetrics and Gynecology

## 2018-12-13 DIAGNOSIS — Z1231 Encounter for screening mammogram for malignant neoplasm of breast: Secondary | ICD-10-CM

## 2018-12-15 ENCOUNTER — Other Ambulatory Visit: Payer: Self-pay | Admitting: Family Medicine

## 2018-12-18 ENCOUNTER — Ambulatory Visit (HOSPITAL_BASED_OUTPATIENT_CLINIC_OR_DEPARTMENT_OTHER)
Admission: RE | Admit: 2018-12-18 | Discharge: 2018-12-18 | Disposition: A | Discharge status: Home / Self Care | Payer: 59 | Source: Ambulatory Visit | Attending: Obstetrics and Gynecology | Admitting: Obstetrics and Gynecology

## 2018-12-18 ENCOUNTER — Other Ambulatory Visit: Payer: Self-pay

## 2018-12-18 DIAGNOSIS — Z1231 Encounter for screening mammogram for malignant neoplasm of breast: Secondary | ICD-10-CM | POA: Diagnosis present

## 2019-01-27 ENCOUNTER — Encounter: Payer: Self-pay | Admitting: Family Medicine

## 2019-01-27 LAB — COLOGUARD

## 2019-02-03 ENCOUNTER — Encounter: Payer: Self-pay | Admitting: Family Medicine

## 2019-05-30 ENCOUNTER — Encounter: Payer: Self-pay | Admitting: Family Medicine

## 2019-06-09 ENCOUNTER — Telehealth: Payer: 59 | Admitting: Family Medicine

## 2019-07-30 ENCOUNTER — Other Ambulatory Visit: Payer: Self-pay

## 2019-07-30 ENCOUNTER — Encounter: Payer: Self-pay | Admitting: Family

## 2019-07-30 ENCOUNTER — Ambulatory Visit: Payer: 59 | Admitting: Family

## 2019-07-30 VITALS — BP 133/88 | HR 102 | Temp 97.4°F | Resp 16 | Ht 70.0 in | Wt 210.0 lb

## 2019-07-30 DIAGNOSIS — G43909 Migraine, unspecified, not intractable, without status migrainosus: Secondary | ICD-10-CM | POA: Diagnosis not present

## 2019-07-30 MED ORDER — SUMATRIPTAN SUCCINATE 50 MG PO TABS: ORAL_TABLET | ORAL | 3 refills | Status: DC

## 2019-07-30 MED ORDER — AMITRIPTYLINE HCL 10 MG PO TABS: 10.0000 mg | ORAL_TABLET | Freq: Every day | ORAL | 1 refills | Status: DC

## 2019-07-30 MED ORDER — ONDANSETRON HCL 4 MG PO TABS: 4.0000 mg | ORAL_TABLET | Freq: Three times a day (TID) | ORAL | 0 refills | Status: DC | PRN

## 2019-07-30 NOTE — Patient Instructions (Signed)
Please start elavil once daily. You may use zofran as needed for nausea. Use imitrex as needed for severe headache.

## 2019-07-30 NOTE — Progress Notes (Signed)
   Subjective:    Patient ID: Evelyn Murray, female    DOB: 1970-09-10, 49 y.o.   MRN: [id number]  HPI   Patient is a 49 yr old female who presents today to discuss migraine headache.   Subjective:    Evelyn Murray is a 49 y.o. female who presents for evaluation of headache.   Headache Patient presents for evaluation of headache. She Reports that she had 2 migraines last week. One on Thursday and on Saturday. Saturday she took two imitrex  without improvement. Had associated nausea. Week before, she had a migraine that lasted two days.   She states that she used to be on elavil for migraine prophylaxis which was helpful. She reports that her migraines were improved and this was ultimately discontinued.  HA's are waking her up from her sleep for the past month on/off.  Never used to happen in the middle of the night.  She denies hx of snoring.   Reports HA's are generally on the right side.  Pulsing, pounding. She reports that is working with Dr. Samson Frederic at [facility] ophthalmology due to flashes of light which he told her were related to a retinal condition.  Was told that it does not appear that there will be a retinal tear and that she could follow up in 1 year if stable.      Objective:     Assessment:    Plan:  Review of Systems     Objective:   Physical Exam Constitutional:      Appearance: She is well-developed.  HENT:     Nose:     Right Sinus: No maxillary sinus tenderness or frontal sinus tenderness.     Left Sinus: No maxillary sinus tenderness or frontal sinus tenderness.  Cardiovascular:     Rate and Rhythm: Normal rate and regular rhythm.     Heart sounds: Normal heart sounds. No murmur.  Pulmonary:     Effort: Pulmonary effort is normal. No respiratory distress.     Breath sounds: Normal breath sounds. No wheezing.  Neurological:     General: No focal deficit present.     Mental Status: She is oriented to person, place, and time.     Cranial Nerves: No  cranial nerve deficit.     Motor: No weakness.     Gait: Gait normal.  Psychiatric:        Behavior: Behavior normal.        Thought Content: Thought content normal.        Judgment: Judgment normal.           Assessment & Plan:  Migraine headaches- uncontrolled. Will initiate low dose elavil which she has responded to in the past. Add zofran prn nausea/vomiting. She is advised against overuse of imitrex due to risk of rebound headache. Recommend follow up with PCP in 2 weeks. If symptoms worsen or fail to improve, consider referral to neurology/additional imaging.    This visit occurred during the SARS-CoV-2 public health emergency.  Safety protocols were in place, including screening questions prior to the visit, additional usage of staff PPE, and extensive cleaning of exam room while observing appropriate contact time as indicated for disinfecting solutions.

## 2019-08-18 NOTE — Progress Notes (Signed)
[facility] [address], [address] [place], [place] [address] [phone]  Date:  08/20/2019   Name:  Evelyn Murray   DOB:  06/07/1970   MRN:  [id number]  PCP:  Pearline Cables, MD    Chief Complaint: Migraine (2 week follow up) and Abdominal Pain (right lower abdominal pain, off and on 2 months, no n/v, no fever)   History of Present Illness:  Evelyn Murray is a 49 y.o. very pleasant female patient who presents with the following:  History of migraine headache, cervical disc disorder, low back pain, insomnia and anxiety, asthma Here today for short-term follow-up visit.  Patient was seen by Efraim Kaufmann on June 9 with concern of migraine.  She was started on low-dose amitriptyline which had been helpful in the past Patient last seen by myself about 1 year ago Tetanus vaccine Hep C screening due HIV screening due--patient does not wish to do blood work today  Currently taking amitriptyline 10 mg at bedtime for migraine prophylaxis She has had 2 migraine HA since she went on amitriptyline; it has seemed to help decrease her headache frequency somewhat She has a long history of migraine headaches, but notes that recently her headaches have changed and that they are starting during the night while she is asleep.  This makes it impossible for her to take an Imitrex right away to abort symptoms.  This has started over the last few months  She tried taking imitrex a couple of times for these headaches, it is not as helpful as it has been previously She wonders if sinus symptoms may be contributing.  She notes some pressure and congestion in her sinuses and face She may get nauseated and vomited once only with migraine headache She has zofran to use prn No aura noted  She will get phono and photophobia   She does not have sneezing or runny nose She is using zyrtec and nasacort No cough She may get some sinus pressure off and  on No nasal discharge in general  She also has noted some mild RLQ pain for 3 months off and on.  Sometimes seems to be associated with a feeling of needing to urinate.  It will wax and wane, is never severe No particular change in her bowel pattern Cologuard done in December No hematuria She no longer gets menses- menopausal for at least a year   Patient Active Problem List   Diagnosis Date Noted  . Migraine 12/05/2013  . Insomnia secondary to anxiety 11/07/2013  . Hip flexor tendinitis 07/28/2013  . 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 Dysplasia of cervix, low grade (CIN 1) 09/06/2011  . Asthma with allergic rhinitis 08/20/2011  . Pinched nerve 02/16/2011  . Low back pain 02/16/2011  . Insomnia 02/16/2011    Past Medical History:  Diagnosis Date  . Abnormal Pap smear 10/1998  . Allergy   . ASCUS (atypical squamous cells of undetermined significance) on Pap smear 2006  . Chicken pox   . Chlamydia infection 02/1998   Treated with  oxacillin  . CIN I (cervical intraepithelial neoplasia I) 2011  . HPV in female 2001  . Hx of migraines   . Insomnia 2007  . LGSIL (low grade squamous intraepithelial dysplasia)   . Migraines   . UTI (lower urinary tract infection)     Past Surgical History:  Procedure Laterality Date  . CESAREAN SECTION  Social History   Tobacco Use  . Smoking status: Never Smoker  . Smokeless tobacco: Never Used  Substance Use Topics  . Alcohol use: No  . Drug use: No    Family History  Problem Relation Age of Onset  . Hypertension Mother   . Hypertension Father   . Hypertension Maternal Grandfather     No Known Allergies  Medication list has been reviewed and updated.  Current Outpatient Medications on File Prior to Visit  Medication Sig Dispense Refill  . amitriptyline (ELAVIL) 10 MG tablet Take 1 tablet (10 mg total) by mouth at bedtime. 30 tablet 1  . cyclobenzaprine (FLEXERIL) 10 MG tablet  TAKE 1 TABLET BY MOUTH THREE TIMES DAILY AS NEEDED FOR MUSCLE SPASMS 90 tablet 1  . estradiol (CLIMARA - DOSED IN MG/24 HR) 0.0375 mg/24hr patch Place 0.0375 mg onto the skin once a week.    Marland Kitchen omeprazole (PRILOSEC) 40 MG capsule Take 40 mg by mouth daily.    . ondansetron (ZOFRAN) 4 MG tablet Take 1 tablet (4 mg total) by mouth every 8 (eight) hours as needed for nausea or vomiting. 20 tablet 0  . SUMAtriptan (IMITREX) 50 MG tablet TAKE 1 TABLET BY MOUTH 1 TIME AS NEEDED FOR MIGRAINE. MAY REPEAT IN 2 HOURS IF HEADACHE PERSISTS OR RECURS 10 tablet 3  . progesterone (PROMETRIUM) 100 MG capsule progesterone micronized 100 mg capsule  TAKE 1 CAPSULE BY MOUTH EVERY DAY AT BEDTIME FOR 12 DAYS     No current facility-administered medications on file prior to visit.    Review of Systems:  As per HPI- otherwise negative.   Physical Examination: Vitals:   08/20/19 1600  BP: 122/80  Pulse: 70  Resp: 16   Vitals:   08/20/19 1600  Weight: 212 lb (96.2 kg)  Height: 5\' 10"  (1.778 m)   Body mass index is 30.42 kg/m. Ideal Body Weight: Weight in (lb) to have BMI = 25: 173.9  GEN: no acute distress.  Obese, tall build.  Looks well  Bilateral TM wnl, oropharynx normal.  PEERL,EOMI.   HEENT: Atraumatic, Normocephalic.  Ears and Nose: No external deformity. CV: RRR, No M/G/R. No JVD. No thrill. No extra heart sounds. PULM: CTA B, no wheezes, crackles, rhonchi. No retractions. No resp. distress. No accessory muscle use. ABD: S, ND, +BS. No rebound. No HSM.  The patient notes a minimal vague right lower quadrant tenderness on exam EXTR: No c/c/e PSYCH: Normally interactive. Conversant.  Normal strength, sensation, DTR of all extremities.  Normal sensation and movement of facial muscles   Assessment and Plan: Migraine without status migrainosus, not intractable, unspecified migraine type - Plan: amitriptyline (ELAVIL) 25 MG tablet  Sinus pain - Plan: predniSONE (DELTASONE) 20 MG  tablet  Chronic RLQ pain - Plan: Urine Culture, POCT urinalysis dipstick  Here today with a couple of concerns.  Evelyn Murray's migraine pattern has changed recently, and her headaches tend to occur during the night.  When she wakes up and takes Imitrex it is not working as well as it did previously.  She recently started on 10 mg of amitriptyline at bedtime which is helping somewhat.  She would like to try increasing 25 mg She also wonders if sinus inflammation pressure may be playing a role.  We will have her try 20 mg of prednisone daily for 5 days and see if that is helpful If her headaches do not return to normal pattern, plan to have her seen by neuro   She also  mentions right lower quadrant pain which has been present on and off for several months.  To her, it seems as though it may be related to a urinary issue or even a UTI.  We will check a UA and urine culture today.  If this is negative, we will have her pursue further follow-up.  I was not able to get blood work today as our lab closes at 4 PM She will seek immediate care if her symptoms are getting worse This visit occurred during the SARS-CoV-2 public health emergency.  Safety protocols were in place, including screening questions prior to the visit, additional usage of staff PPE, and extensive cleaning of exam room while observing appropriate contact time as indicated for disinfecting solutions.    Signed Abbe Amsterdam, MD  Results for orders placed or performed in visit on 08/20/19  POCT urinalysis dipstick  Result Value Ref Range   Color, UA yellow yellow   Clarity, UA clear clear   Glucose, UA negative negative mg/dL   Bilirubin, UA negative negative   Ketones, POC UA negative negative mg/dL   Spec Grav, UA 9.563 8.756 - 1.025   Blood, UA negative negative   pH, UA 6.0 5.0 - 8.0   Protein Ur, POC negative negative mg/dL   Urobilinogen, UA 0.2 0.2 or 1.0 E.U./dL   Nitrite, UA Negative Negative   Leukocytes, UA Negative  Negative

## 2019-08-20 ENCOUNTER — Encounter: Payer: Self-pay | Admitting: Family Medicine

## 2019-08-20 ENCOUNTER — Ambulatory Visit: Payer: 59 | Admitting: Family Medicine

## 2019-08-20 ENCOUNTER — Other Ambulatory Visit: Payer: Self-pay

## 2019-08-20 VITALS — BP 122/80 | HR 70 | Resp 16 | Ht 70.0 in | Wt 212.0 lb

## 2019-08-20 DIAGNOSIS — J3489 Other specified disorders of nose and nasal sinuses: Secondary | ICD-10-CM | POA: Diagnosis not present

## 2019-08-20 DIAGNOSIS — G43909 Migraine, unspecified, not intractable, without status migrainosus: Secondary | ICD-10-CM

## 2019-08-20 DIAGNOSIS — G8929 Other chronic pain: Secondary | ICD-10-CM | POA: Diagnosis not present

## 2019-08-20 DIAGNOSIS — R1031 Right lower quadrant pain: Secondary | ICD-10-CM

## 2019-08-20 LAB — POCT URINALYSIS DIP (MANUAL ENTRY)
Bilirubin, UA: NEGATIVE
Blood, UA: NEGATIVE
Glucose, UA: NEGATIVE mg/dL
Ketones, POC UA: NEGATIVE mg/dL
Leukocytes, UA: NEGATIVE
Nitrite, UA: NEGATIVE
Protein Ur, POC: NEGATIVE mg/dL
Spec Grav, UA: 1.015 (ref 1.010–1.025)
Urobilinogen, UA: 0.2 E.U./dL
pH, UA: 6 (ref 5.0–8.0)

## 2019-08-20 MED ORDER — PREDNISONE 20 MG PO TABS: ORAL_TABLET | ORAL | 0 refills | Status: DC

## 2019-08-20 MED ORDER — AMITRIPTYLINE HCL 25 MG PO TABS: 25.0000 mg | ORAL_TABLET | Freq: Every day | ORAL | 1 refills | Status: DC

## 2019-08-20 NOTE — Patient Instructions (Signed)
Good to see you again today!  We will try increasing your amitriptyline to 25 mg for HA prevention.  Also we will use prednisone 20 mg daily for 5 days in case this is due to sinus inflammation  I will be in touch with your urine results asap!

## 2019-08-21 LAB — URINE CULTURE
MICRO NUMBER:: 10652637
Result:: NO GROWTH
SPECIMEN QUALITY:: ADEQUATE

## 2019-08-22 ENCOUNTER — Encounter: Payer: Self-pay | Admitting: Family Medicine

## 2019-09-24 ENCOUNTER — Encounter: Payer: Self-pay | Admitting: Family Medicine

## 2019-09-24 DIAGNOSIS — H43819 Vitreous degeneration, unspecified eye: Secondary | ICD-10-CM | POA: Insufficient documentation

## 2019-11-11 ENCOUNTER — Other Ambulatory Visit (HOSPITAL_BASED_OUTPATIENT_CLINIC_OR_DEPARTMENT_OTHER): Payer: Self-pay | Admitting: Obstetrics and Gynecology

## 2019-11-11 ENCOUNTER — Encounter: Payer: Self-pay | Admitting: Family Medicine

## 2019-11-11 DIAGNOSIS — R1031 Right lower quadrant pain: Secondary | ICD-10-CM

## 2019-11-11 DIAGNOSIS — Z1231 Encounter for screening mammogram for malignant neoplasm of breast: Secondary | ICD-10-CM

## 2019-11-11 DIAGNOSIS — G8929 Other chronic pain: Secondary | ICD-10-CM

## 2019-11-11 NOTE — Addendum Note (Signed)
Addended by: Abbe Amsterdam C on: 11/11/2019 04:15 PM   Modules accepted: Orders

## 2019-11-18 ENCOUNTER — Other Ambulatory Visit: Payer: Self-pay | Admitting: Family Medicine

## 2019-11-18 DIAGNOSIS — G43909 Migraine, unspecified, not intractable, without status migrainosus: Secondary | ICD-10-CM

## 2019-11-19 ENCOUNTER — Ambulatory Visit (HOSPITAL_BASED_OUTPATIENT_CLINIC_OR_DEPARTMENT_OTHER)
Admission: RE | Admit: 2019-11-19 | Discharge: 2019-11-19 | Disposition: A | Discharge status: Home / Self Care | Payer: 59 | Source: Ambulatory Visit | Attending: Family Medicine | Admitting: Family Medicine

## 2019-11-19 ENCOUNTER — Encounter: Payer: Self-pay | Admitting: Family Medicine

## 2019-11-19 ENCOUNTER — Other Ambulatory Visit: Payer: Self-pay

## 2019-11-19 DIAGNOSIS — R1031 Right lower quadrant pain: Secondary | ICD-10-CM | POA: Diagnosis not present

## 2019-11-19 DIAGNOSIS — G8929 Other chronic pain: Secondary | ICD-10-CM | POA: Diagnosis present

## 2019-12-22 ENCOUNTER — Other Ambulatory Visit: Payer: Self-pay

## 2019-12-22 ENCOUNTER — Encounter (HOSPITAL_BASED_OUTPATIENT_CLINIC_OR_DEPARTMENT_OTHER): Payer: Self-pay

## 2019-12-22 ENCOUNTER — Ambulatory Visit (HOSPITAL_BASED_OUTPATIENT_CLINIC_OR_DEPARTMENT_OTHER)
Admission: RE | Admit: 2019-12-22 | Discharge: 2019-12-22 | Disposition: A | Discharge status: Home / Self Care | Payer: 59 | Source: Ambulatory Visit | Attending: Obstetrics and Gynecology | Admitting: Obstetrics and Gynecology

## 2019-12-22 DIAGNOSIS — Z1231 Encounter for screening mammogram for malignant neoplasm of breast: Secondary | ICD-10-CM | POA: Insufficient documentation

## 2020-03-04 ENCOUNTER — Telehealth (INDEPENDENT_AMBULATORY_CARE_PROVIDER_SITE_OTHER): Payer: 59 | Admitting: Family Medicine

## 2020-03-04 DIAGNOSIS — J329 Chronic sinusitis, unspecified: Secondary | ICD-10-CM

## 2020-03-04 MED ORDER — AMOXICILLIN-POT CLAVULANATE 875-125 MG PO TABS: 1.0000 | ORAL_TABLET | Freq: Two times a day (BID) | ORAL | 0 refills | Status: DC

## 2020-03-04 NOTE — Progress Notes (Signed)
Virtual Visit via Video Note  I connected with Evelyn Murray  on 03/04/20 at 12:40 PM EST by a video enabled telemedicine application and verified that I am speaking with the correct person using two identifiers.  Location patient: home, [place] Location provider:work or home office Persons participating in the virtual visit: patient, provider  I discussed the limitations of evaluation and management by telemedicine and the availability of in person appointments. The patient expressed understanding and agreed to proceed.   HPI:  Acute telemedicine visit for Sinus issues: -Onset: about 10 days ago -Symptoms include: sore throat, nasal congestion, PND, was starting to get better, but then got worse again with thick sinus congestion, sinus pain and pressure -Denies: known sick contacts, fevers, body aches, NVD, CP, SOB -Has tried: sudafed, nyquil, tylenol, nasocort, musinex -Pertinent past medical history: denies hx immunocompromise, resp disease - but reports history of sinus infection -Pertinent medication allergies: nkda -COVID-19 vaccine status: vaccinated + booster and had flu shot  ROS: See pertinent positives and negatives per HPI.  Past Medical History:  Diagnosis Date  . Abnormal Pap smear 10/1998  . Allergy   . ASCUS (atypical squamous cells of undetermined significance) on Pap smear 2006  . Chicken pox   . Chlamydia infection 02/1998   Treated with  oxacillin  . CIN I (cervical intraepithelial neoplasia I) 2011  . HPV in female 2001  . Hx of migraines   . Insomnia 2007  . LGSIL (low grade squamous intraepithelial dysplasia)   . Migraines   . UTI (lower urinary tract infection)     Past Surgical History:  Procedure Laterality Date  . CESAREAN SECTION       Current Outpatient Medications:  .  amoxicillin-clavulanate (AUGMENTIN) 875-125 MG tablet, Take 1 tablet by mouth 2 (two) times daily., Disp: 20 tablet, Rfl: 0 .  amitriptyline (ELAVIL) 25 MG tablet, TAKE 1 TABLET(25 MG)  BY MOUTH AT BEDTIME, Disp: 30 tablet, Rfl: 5 .  cyclobenzaprine (FLEXERIL) 10 MG tablet, TAKE 1 TABLET BY MOUTH THREE TIMES DAILY AS NEEDED FOR MUSCLE SPASMS, Disp: 90 tablet, Rfl: 1 .  estradiol (CLIMARA - DOSED IN MG/24 HR) 0.0375 mg/24hr patch, Place 0.0375 mg onto the skin once a week., Disp: , Rfl:  .  omeprazole (PRILOSEC) 40 MG capsule, Take 40 mg by mouth daily., Disp: , Rfl:  .  ondansetron (ZOFRAN) 4 MG tablet, Take 1 tablet (4 mg total) by mouth every 8 (eight) hours as needed for nausea or vomiting., Disp: 20 tablet, Rfl: 0 .  predniSONE (DELTASONE) 20 MG tablet, Take 20 mg daily for 5 days, Disp: 5 tablet, Rfl: 0 .  progesterone (PROMETRIUM) 100 MG capsule, progesterone micronized 100 mg capsule  TAKE 1 CAPSULE BY MOUTH EVERY DAY AT BEDTIME FOR 12 DAYS, Disp: , Rfl:  .  SUMAtriptan (IMITREX) 50 MG tablet, TAKE 1 TABLET BY MOUTH 1 TIME AS NEEDED FOR MIGRAINE. MAY REPEAT IN 2 HOURS IF HEADACHE PERSISTS OR RECURS, Disp: 10 tablet, Rfl: 3  EXAM:  VITALS per patient if applicable:  GENERAL: alert, oriented, appears well and in no acute distress  HEENT: atraumatic, conjunttiva clear, no obvious abnormalities on inspection of external nose and ears  NECK: normal movements of the head and neck  LUNGS: on inspection no signs of respiratory distress, breathing rate appears normal, no obvious gross SOB, gasping or wheezing  CV: no obvious cyanosis  MS: moves all visible extremities without noticeable abnormality  PSYCH/NEURO: pleasant and cooperative, no obvious depression or anxiety, speech  and thought processing grossly intact  ASSESSMENT AND PLAN:  Discussed the following assessment and plan:  Sinusitis, unspecified chronicity, unspecified location  -we discussed possible serious and likely etiologies, options for evaluation and workup, limitations of telemedicine visit vs in person visit, treatment, treatment risks and precautions. Pt prefers to treat via telemedicine  empirically rather than in person at this moment.  Current sinusitis given duration of symptoms with worsening, versus new acute illness, viral upper respiratory infection, COVID or other.  Opted for treatment with Augmentin 875 twice daily for 10 days in case of sinusitis.  Also discussed nasal saline, humidifier and other symptomatic care measures that are available over-the-counter.  Discussed options for COVID testing, treatment, potential complications and precautions.  Advised to stay home while sick. Work/School slipped offered:declined Scheduled follow up with PCP offered: Agrees to follow-up if needed. Advised to seek prompt in person care if worsening, new symptoms arise, or if is not improving with treatment. Discussed options for inperson care if PCP office not available. Did let this patient know that I only do telemedicine on Tuesdays and Thursdays for [place]. Advised to schedule follow up visit with PCP or UCC if any further questions or concerns to avoid delays in care.   I discussed the assessment and treatment plan with the patient. The patient was provided an opportunity to ask questions and all were answered. The patient agreed with the plan and demonstrated an understanding of the instructions.     Terressa Koyanagi, DO

## 2020-03-04 NOTE — Patient Instructions (Signed)
-  I sent the medication(s) we discussed to your pharmacy: Meds ordered this encounter  Medications  . amoxicillin-clavulanate (AUGMENTIN) 875-125 MG tablet    Sig: Take 1 tablet by mouth 2 (two) times daily.    Dispense:  20 tablet    Refill:  0   Get a COVID test as we discussed.  Please stay home while feeling sick.  I hope you are feeling better soon!  Seek in person care promptly if your symptoms worsen, new concerns arise or you are not improving with treatment.  It was nice to meet you today. I help [facility] out with telemedicine visits on Tuesdays and Thursdays and am available for visits on those days. If you have any concerns or questions following this visit please schedule a follow up visit with your Primary Care doctor or seek care at a local urgent care clinic to avoid delays in care.

## 2020-08-30 IMAGING — DX DG HIP (WITH OR WITHOUT PELVIS) 2-3V*R*
3 series · 3 of 3 positions shown · non-contrast
Comparison: None.

CLINICAL DATA: C/o low back pain x 30 years, recently pain has
started radiating down right hip NKI

EXAM:
DG HIP (WITH OR WITHOUT PELVIS) 2-3V RIGHT

[pelvis ap]
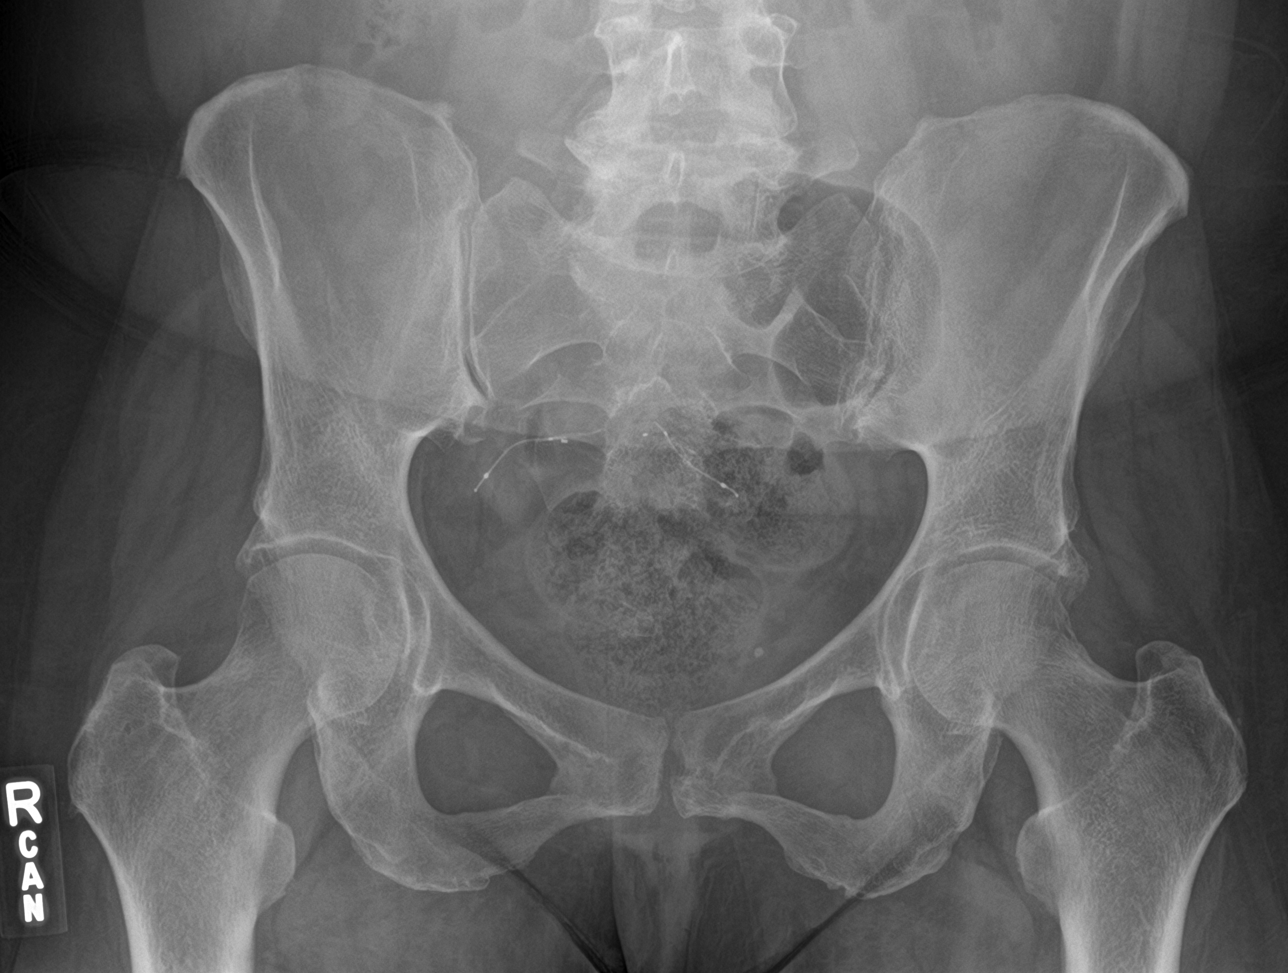

[hip ap]
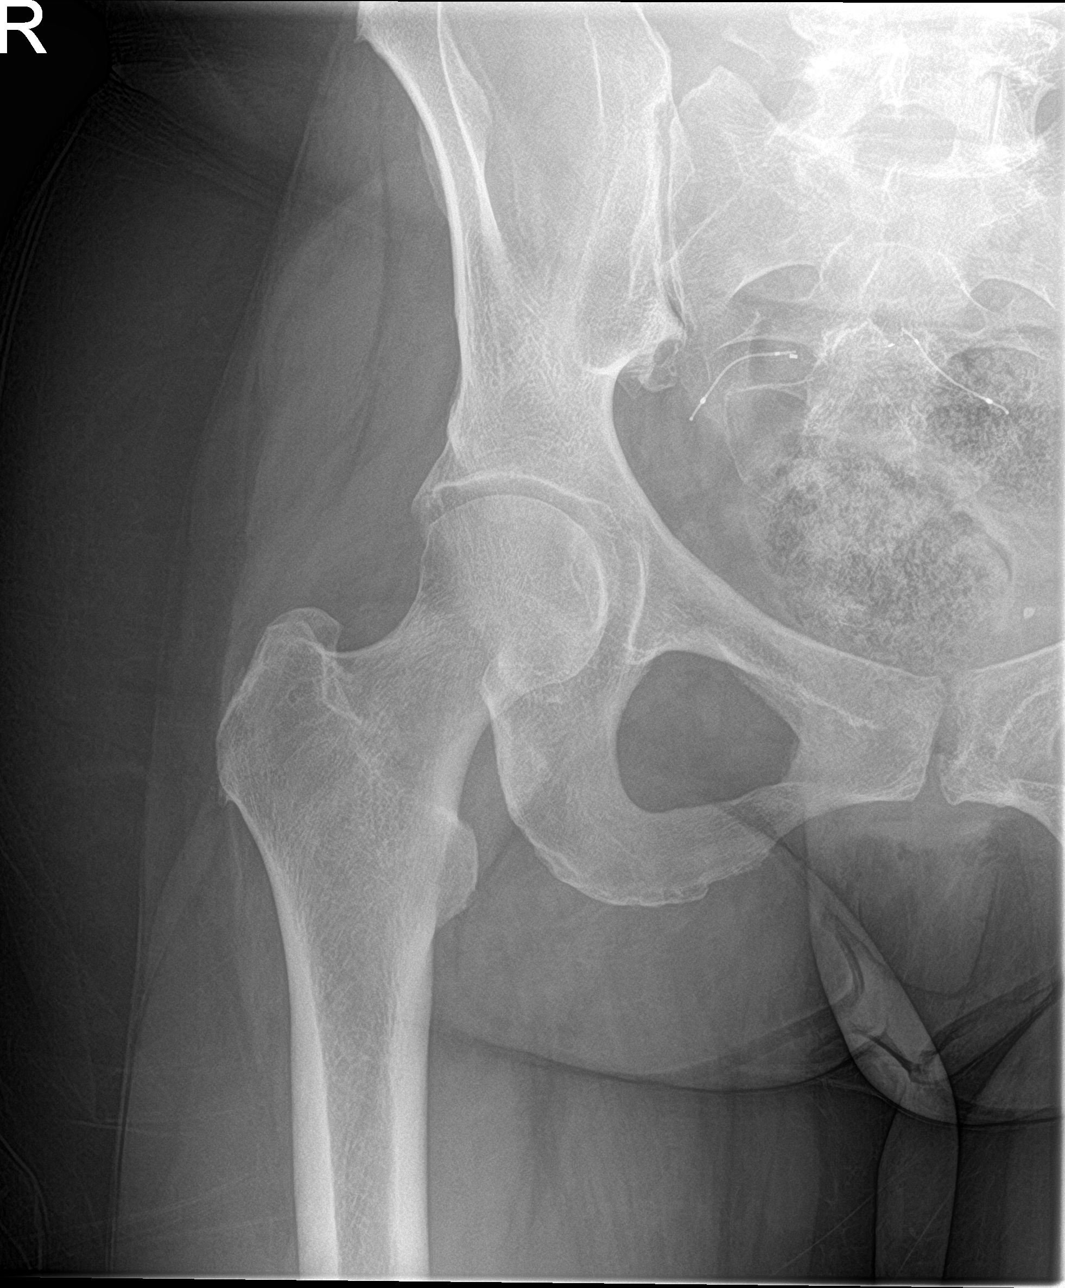

[hip lat]
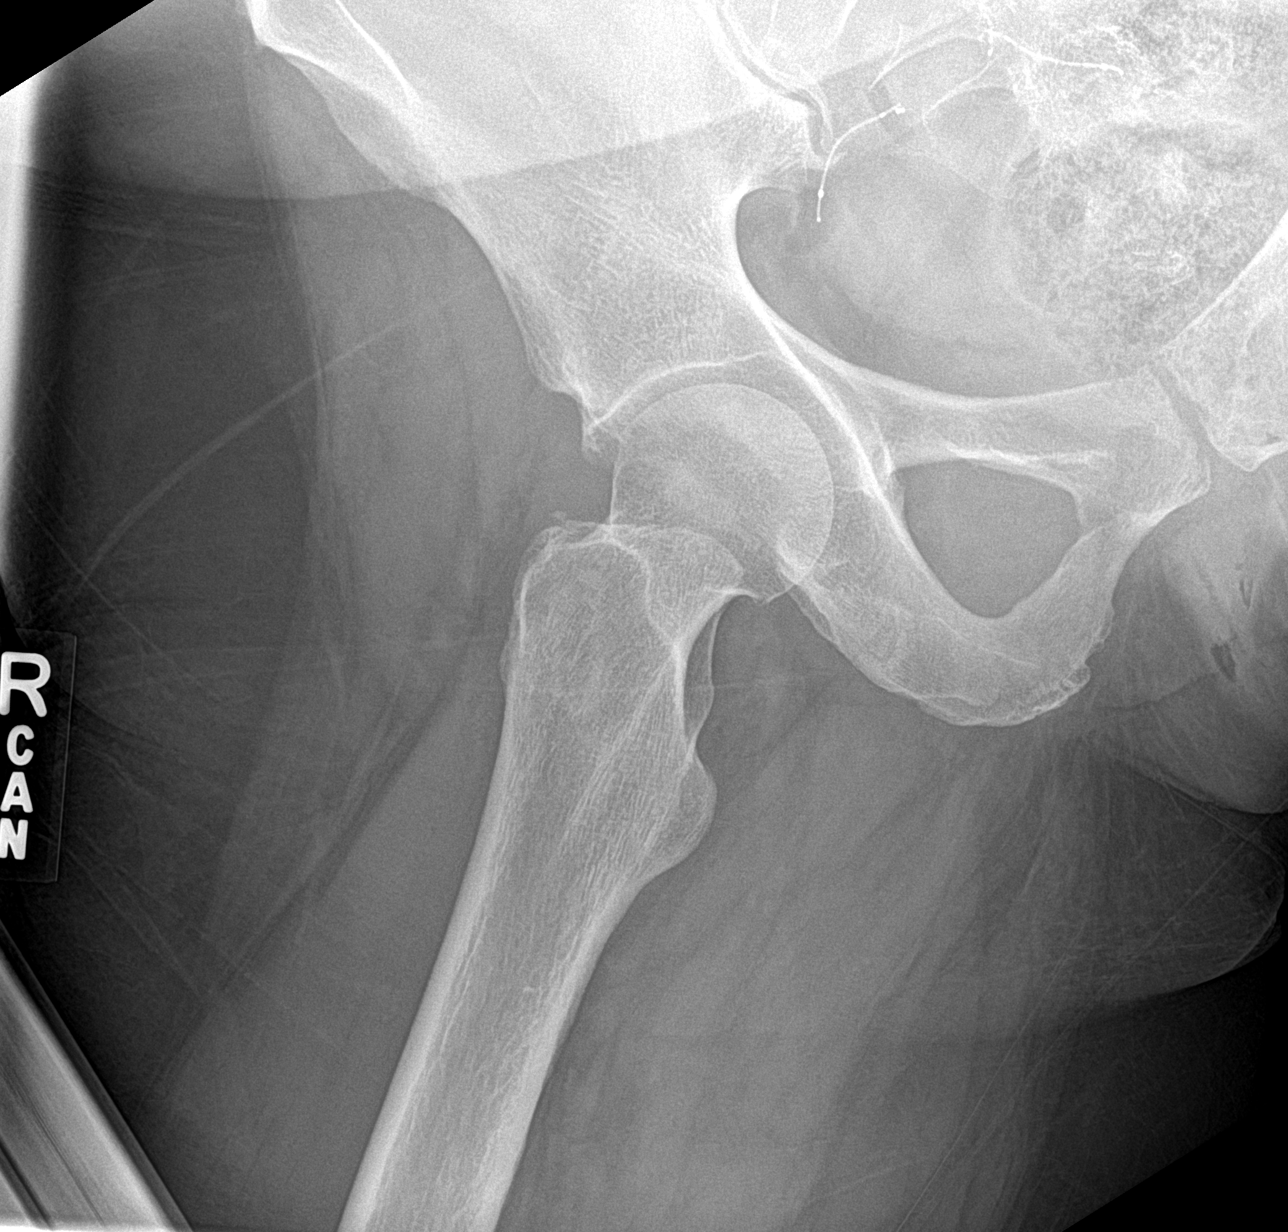

[3 of 3 positions shown; findings below may reference images not displayed]

FINDINGS: There is no evidence of hip fracture or dislocation. There is no
evidence of arthropathy or other focal bone abnormality. Essure
device is identified within the central pelvis.
IMPRESSION: Negative.

## 2020-08-30 IMAGING — DX DG LUMBAR SPINE COMPLETE 4+V
5 series · 5 of 5 positions shown · non-contrast
Comparison: 07/22/2009
COMPARISON: 07/22/2009

Addendum:
CLINICAL DATA: C/o low back pain x 30 years, recently pain has
started radiating down right hip NKI

EXAM:
LUMBAR SPINE - COMPLETE 4+ VIEW

[l-spine ap]
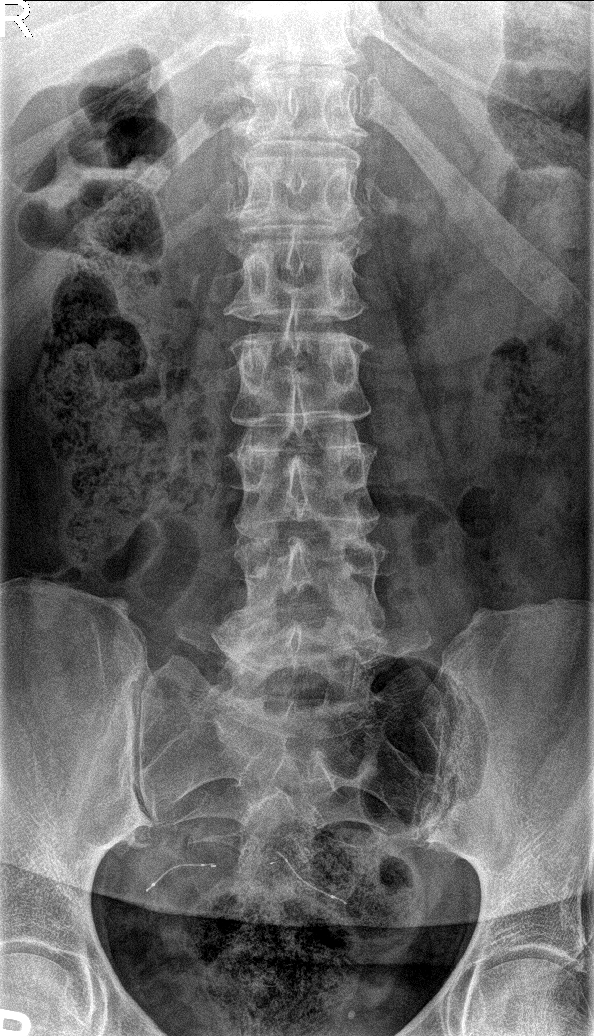

[l-spine obl (1 of 2)]
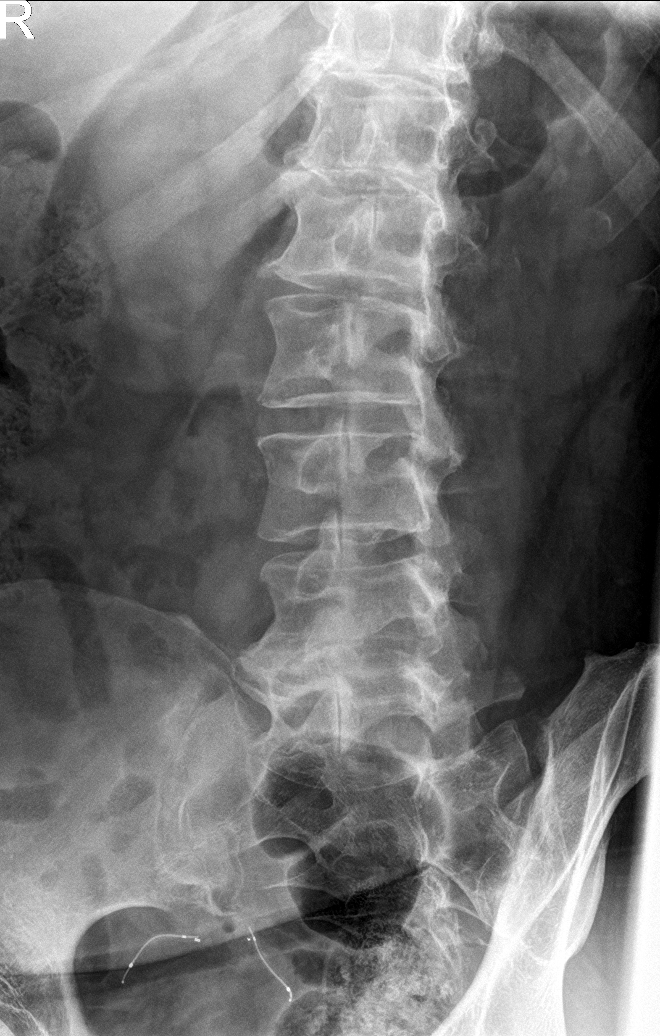

[l-spine obl (2 of 2)]
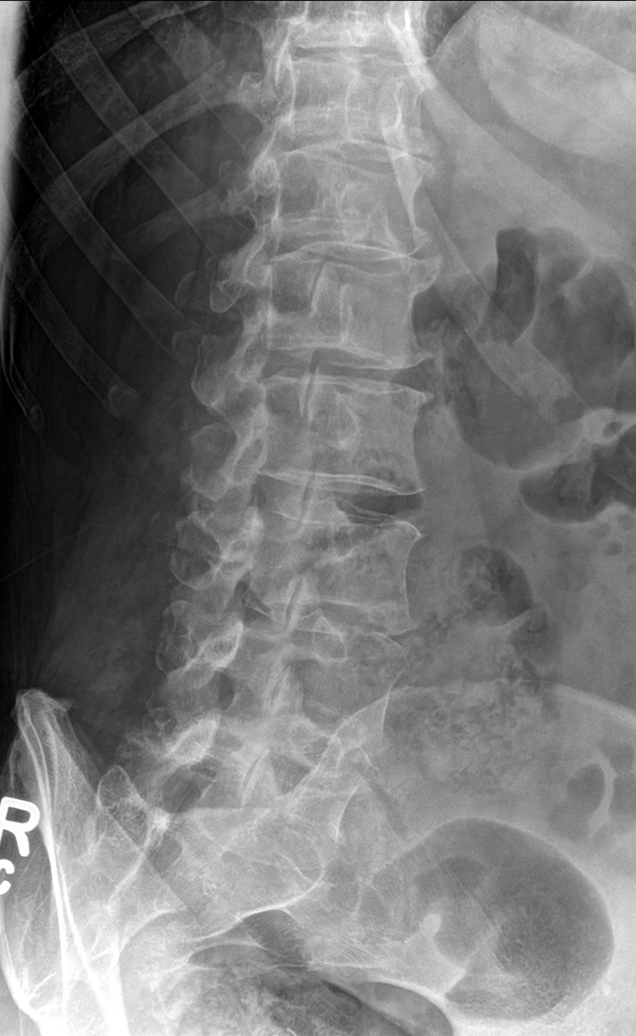

[l-spine lat]
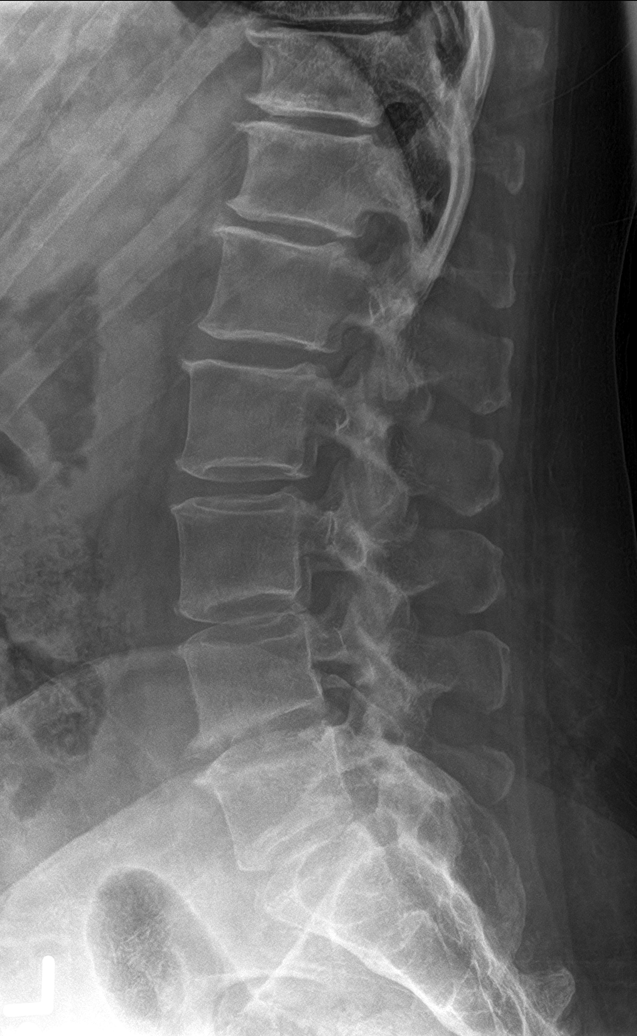

[l-spine spot]
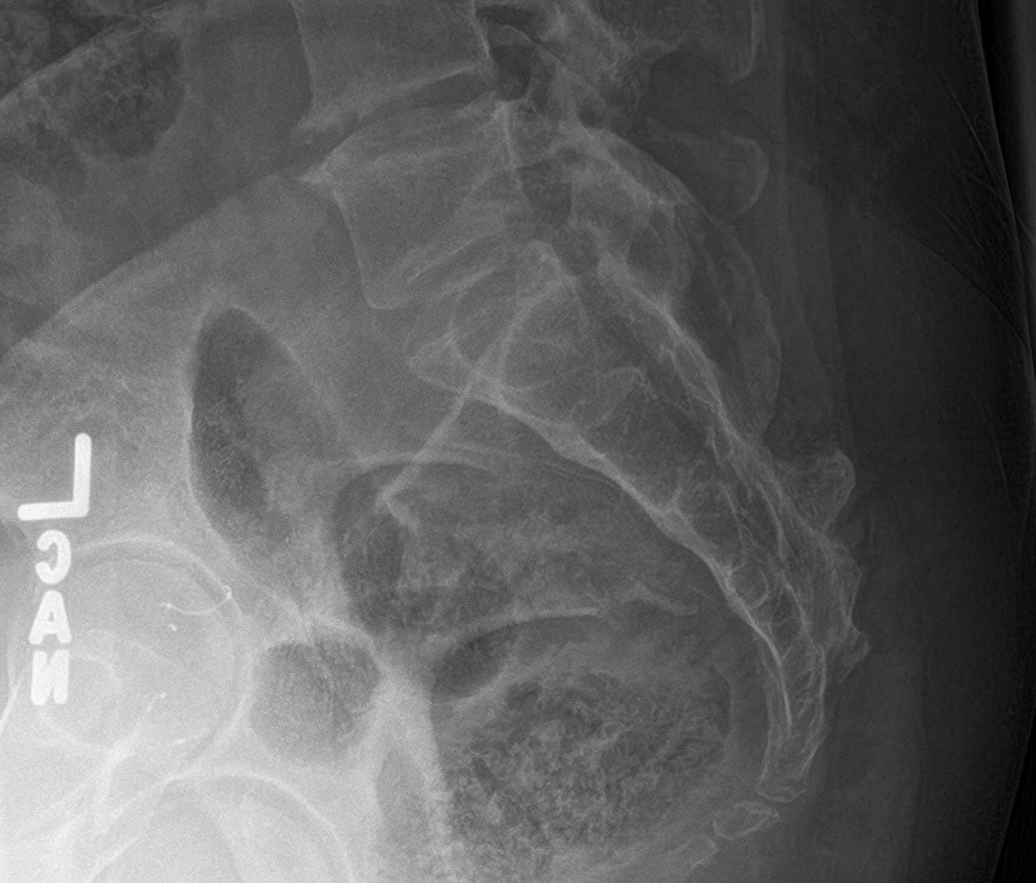

[5 of 5 positions shown; findings below may reference images not displayed]

FINDINGS: There are moderate degenerative changes in the lumbar spine. There
is normal alignment. The superior endplate of L5 and the inferior
endplate of L4 appear regular. Considerations include degenerative
change or discitis. Uncovertebral spurring identified in the LOWER
thoracic levels. There is facet hypertrophy in the LOWER lumbar
spine. No suspicious lytic or blastic lesions are identified.

Bilateral Essure devices identified in the central pelvis.
IMPRESSION: 1. Irregular endplate changes at disc space L4-5, raising the
question of discitis.
2. Consider further evaluation with MRI.
3. These results will be called to the ordering clinician or
representative by the Radiologist Assistant, and communication
documented in the PACS or zVision Dashboard.

ADDENDUM:
The findings are discussed with Dr. Chinchilla.

*** End of Addendum ***
FINDINGS: There are moderate degenerative changes in the lumbar spine. There
is normal alignment. The superior endplate of L5 and the inferior
endplate of L4 appear regular. Considerations include degenerative
change or discitis. Uncovertebral spurring identified in the LOWER
thoracic levels. There is facet hypertrophy in the LOWER lumbar
spine. No suspicious lytic or blastic lesions are identified.

Bilateral Essure devices identified in the central pelvis.
IMPRESSION: 1. Irregular endplate changes at disc space L4-5, raising the
question of discitis.
2. Consider further evaluation with MRI.
3. These results will be called to the ordering clinician or
representative by the Radiologist Assistant, and communication
documented in the PACS or zVision Dashboard.

## 2020-09-05 ENCOUNTER — Other Ambulatory Visit: Payer: Self-pay | Admitting: Family

## 2020-11-24 ENCOUNTER — Other Ambulatory Visit (HOSPITAL_BASED_OUTPATIENT_CLINIC_OR_DEPARTMENT_OTHER): Payer: Self-pay | Admitting: Obstetrics and Gynecology

## 2020-11-24 DIAGNOSIS — Z1231 Encounter for screening mammogram for malignant neoplasm of breast: Secondary | ICD-10-CM

## 2020-12-28 ENCOUNTER — Encounter (HOSPITAL_BASED_OUTPATIENT_CLINIC_OR_DEPARTMENT_OTHER): Payer: Self-pay

## 2020-12-28 ENCOUNTER — Ambulatory Visit (HOSPITAL_BASED_OUTPATIENT_CLINIC_OR_DEPARTMENT_OTHER)
Admission: RE | Admit: 2020-12-28 | Discharge: 2020-12-28 | Disposition: A | Discharge status: Home / Self Care | Payer: 59 | Source: Ambulatory Visit | Attending: Obstetrics and Gynecology | Admitting: Obstetrics and Gynecology

## 2020-12-28 ENCOUNTER — Other Ambulatory Visit: Payer: Self-pay

## 2020-12-28 DIAGNOSIS — Z1231 Encounter for screening mammogram for malignant neoplasm of breast: Secondary | ICD-10-CM | POA: Diagnosis present

## 2020-12-29 ENCOUNTER — Other Ambulatory Visit: Payer: Self-pay | Admitting: Obstetrics and Gynecology

## 2020-12-29 DIAGNOSIS — R928 Other abnormal and inconclusive findings on diagnostic imaging of breast: Secondary | ICD-10-CM

## 2021-01-08 NOTE — Progress Notes (Addendum)
[facility] [address], [address] [place], [place] [address] [phone]  Date:  01/10/2021   Name:  Evelyn Murray   DOB:  Jul 17, 1970   MRN:  [id number]  PCP:  Pearline Cables, MD    Chief Complaint: Migraine (Continuous- nothing helps. /Concerns/ questions: Pt asks for refill on Flexeril but at a higher dose, and Elavil.) and Right arm pain (Pt suspects this to be from the covid booster received last year. Lymph node is still swollen.)   History of Present Illness:  Evelyn Murray is a 50 y.o. very pleasant female patient who presents with the following:  Pt seen today with concern of headaches and back pain Last visit with myself 6/21 History of migraine headache, cervical disc disorder, low back pain, insomnia and anxiety, asthma At our last visit she was taking low dose amitriptyline for HA prevention with some success - we increased her dosage to 25 to try and improve her sx further   She notes that her migraines are waking her up at night- so she cannot abort the sx as they have already started  She will take imitrex and it does help when she is able to take it at the start of HA She ran out of amitriptyline about 6 months ago- she thinks this was helping with the HA and would like to start on this again   She got her covid and flu vaccine both in her right arm simultaneously last year- she noted a swollen axillary node at that time and she continues to have pain in her right shoulder, she notes reduced ROM in her shoulder as well   Covid series - done  Never a smoker Tetanus booster- can do today  Shingrix Pap is due- per GYN in January  Flu shot - done  Mammo UTD- she is doing a follow-up on the right next month due to an abnormality on recent screening  Labs over a year ago  Patient Active Problem List   Diagnosis Date Noted   Posterior vitreous detachment 09/24/2019   Migraine 12/05/2013   Insomnia  secondary to anxiety 11/07/2013   Hip flexor tendinitis 07/28/2013   Cervical disc disorder with radiculopathy of cervical region 07/28/2013   Dysplasia of cervix, low grade (CIN 1) 09/06/2011   Asthma with allergic rhinitis 08/20/2011   Pinched nerve 02/16/2011   Low back pain 02/16/2011   Insomnia 02/16/2011    Past Medical History:  Diagnosis Date   Abnormal Pap smear 10/1998   Allergy    ASCUS (atypical squamous cells of undetermined significance) on Pap smear 2006   Chicken pox    Chlamydia infection 02/1998   Treated with  oxacillin   CIN I (cervical intraepithelial neoplasia I) 2011   HPV in female 2001   Hx of migraines    Insomnia 2007   LGSIL (low grade squamous intraepithelial dysplasia)    Migraines    UTI (lower urinary tract infection)     Past Surgical History:  Procedure Laterality Date   CESAREAN SECTION      Social History   Tobacco Use   Smoking status: Never   Smokeless tobacco: Never  Substance Use Topics   Alcohol use: No   Drug use: No    Family History  Problem Relation Age of Onset   Hypertension Mother    Hypertension Father    Hypertension Maternal Grandfather     No Known  Allergies  Medication list has been reviewed and updated.  Current Outpatient Medications on File Prior to Visit  Medication Sig Dispense Refill   estradiol (CLIMARA - DOSED IN MG/24 HR) 0.0375 mg/24hr patch Place 0.0375 mg onto the skin once a week.     omeprazole (PRILOSEC) 40 MG capsule Take 40 mg by mouth daily.     progesterone (PROMETRIUM) 100 MG capsule progesterone micronized 100 mg capsule  TAKE 1 CAPSULE BY MOUTH EVERY DAY AT BEDTIME FOR 12 DAYS     SUMAtriptan (IMITREX) 50 MG tablet TAKE 1 TABLET BY MOUTH 1 TIME AS NEEDED FOR MIGRAINE. MAY REPEAT IN 2 HOURS IF HEADACHE PERSISTS OR RECURS 10 tablet 3   No current facility-administered medications on file prior to visit.    Review of Systems:  As per HPI- otherwise negative.   Physical  Examination: Vitals:   01/10/21 1533 01/10/21 1932  BP: 132/84   Pulse: (!) 109 96  Resp: 18   Temp: 98.5 F (36.9 C)   SpO2: 100%    Vitals:   01/10/21 1533  Weight: 217 lb (98.4 kg)  Height: 5\' 10"  (1.778 m)   Body mass index is 31.14 kg/m. Ideal Body Weight: Weight in (lb) to have BMI = 25: 173.9  GEN: no acute distress. Obese, otherwise looks well  HEENT: Atraumatic, Normocephalic.  Ears and Nose: No external deformity. CV: RRR, No M/G/R. No JVD. No thrill. No extra heart sounds. PULM: CTA B, no wheezes, crackles, rhonchi. No retractions. No resp. distress. No accessory muscle use. ABD: S, NT, ND. No rebound. No HSM. EXTR: No c/c/e PSYCH: Normally interactive. Conversant.  Right shoulder: mildly decreased ROM to flexion and external/ internal rotation, tender over the right rotator cuff insertion Pt notes a "lymph node" in the right axilla - on exam this may be more of a fatty deposit.  In any case she is getting a diagnotic mammo and soon    Assessment and Plan: Fatigue, unspecified type - Plan: TSH, VITAMIN D 25 Hydroxy (Vit-D Deficiency, Fractures)  Migraine without status migrainosus, not intractable, unspecified migraine type - Plan: amitriptyline (ELAVIL) 25 MG tablet  Encounter for hepatitis C screening test for low risk patient - Plan: Hepatitis C antibody  Screening for hyperlipidemia - Plan: Lipid panel  Screening for diabetes mellitus - Plan: Comprehensive metabolic panel, Hemoglobin A1c  Screening, deficiency anemia, iron - Plan: CBC  Right rotator cuff tendinitis - Plan: Ambulatory referral to Orthopedic Surgery  Immunization due - Plan: Td vaccine greater than or equal to 7yo preservative free IM  Spasm of muscle of lower back - Plan: cyclobenzaprine (FLEXERIL) 10 MG tablet  Pt notes worsening of her migraine HA since stopping amitriptyline Will restart this medication, encouraged her also to consult back with her neurologist about other options  that may be available  Referral to ortho to discuss right shoulder- suspect RC tendonitis   Update tetanus today   Will plan further follow- up pending labs.  She also requests a refill of her flexeril that she uses for her lower back spasms   Signed [place], MD  Received her labs as below 11/22- message to pt  Results for orders placed or performed in visit on 01/10/21  CBC  Result Value Ref Range   WBC 7.3 4.0 - 10.5 K/uL   RBC 3.95 3.87 - 5.11 Mil/uL   Platelets 240.0 150.0 - 400.0 K/uL   Hemoglobin 12.1 12.0 - 15.0 g/dL   HCT 01/12/21 52.7 -  46.0 %   MCV 92.7 78.0 - 100.0 fl   MCHC 33.0 30.0 - 36.0 g/dL   RDW 33.3 54.5 - 62.5 %  Comprehensive metabolic panel  Result Value Ref Range   Sodium 139 135 - 145 mEq/L   Potassium 3.8 3.5 - 5.1 mEq/L   Chloride 102 96 - 112 mEq/L   CO2 27 19 - 32 mEq/L   Glucose, Bld 86 70 - 99 mg/dL   BUN 9 6 - 23 mg/dL   Creatinine, Ser 6.38 0.40 - 1.20 mg/dL   Total Bilirubin 0.2 0.2 - 1.2 mg/dL   Alkaline Phosphatase 74 39 - 117 U/L   AST 17 0 - 37 U/L   ALT 17 0 - 35 U/L   Total Protein 7.3 6.0 - 8.3 g/dL   Albumin 4.3 3.5 - 5.2 g/dL   GFR 93.73 >42.87 mL/min   Calcium 9.3 8.4 - 10.5 mg/dL  Hemoglobin G8T  Result Value Ref Range   Hgb A1c MFr Bld 5.8 4.6 - 6.5 %  Lipid panel  Result Value Ref Range   Cholesterol 124 0 - 200 mg/dL   Triglycerides 157.2 0.0 - 149.0 mg/dL   HDL 62.03 (L) >55.97 mg/dL   VLDL 41.6 0.0 - 38.4 mg/dL   LDL Cholesterol 62 0 - 99 mg/dL   Total CHOL/HDL Ratio 4    NonHDL 89.64   TSH  Result Value Ref Range   TSH 2.25 0.35 - 5.50 uIU/mL  VITAMIN D 25 Hydroxy (Vit-D Deficiency, Fractures)  Result Value Ref Range   VITD 45.03 30.00 - 100.00 ng/mL

## 2021-01-10 ENCOUNTER — Ambulatory Visit: Payer: 59 | Admitting: Family Medicine

## 2021-01-10 ENCOUNTER — Encounter: Payer: Self-pay | Admitting: Family Medicine

## 2021-01-10 ENCOUNTER — Other Ambulatory Visit: Payer: Self-pay

## 2021-01-10 VITALS — BP 132/84 | HR 96 | Temp 98.5°F | Resp 18 | Ht 70.0 in | Wt 217.0 lb

## 2021-01-10 DIAGNOSIS — Z23 Encounter for immunization: Secondary | ICD-10-CM | POA: Diagnosis not present

## 2021-01-10 DIAGNOSIS — G43909 Migraine, unspecified, not intractable, without status migrainosus: Secondary | ICD-10-CM | POA: Diagnosis not present

## 2021-01-10 DIAGNOSIS — Z1159 Encounter for screening for other viral diseases: Secondary | ICD-10-CM

## 2021-01-10 DIAGNOSIS — R5383 Other fatigue: Secondary | ICD-10-CM

## 2021-01-10 DIAGNOSIS — Z130 Encounter for screening for diseases of the blood and blood-forming organs and certain disorders involving the immune mechanism: Secondary | ICD-10-CM | POA: Diagnosis not present

## 2021-01-10 DIAGNOSIS — Z13220 Encounter for screening for lipoid disorders: Secondary | ICD-10-CM

## 2021-01-10 DIAGNOSIS — M62830 Muscle spasm of back: Secondary | ICD-10-CM

## 2021-01-10 DIAGNOSIS — Z131 Encounter for screening for diabetes mellitus: Secondary | ICD-10-CM | POA: Diagnosis not present

## 2021-01-10 DIAGNOSIS — M7581 Other shoulder lesions, right shoulder: Secondary | ICD-10-CM

## 2021-01-10 MED ORDER — CYCLOBENZAPRINE HCL 10 MG PO TABS: 10.0000 mg | ORAL_TABLET | Freq: Three times a day (TID) | ORAL | 1 refills | Status: AC | PRN

## 2021-01-10 MED ORDER — AMITRIPTYLINE HCL 25 MG PO TABS: ORAL_TABLET | ORAL | 3 refills | Status: AC

## 2021-01-10 NOTE — Patient Instructions (Addendum)
Good to see you today!  Tetanus given  Plan for shingles vaccine after Thanksgiving   For headaches- start back on amitriptyline at bedtime. It may be worthwhile to consult with neurology again as well- there are some new options for treating migraine HA I refilled your flexeril- watch out for sedation with this medication  Referral made to orthopedics for your shoulder - I suspect you have rotator cuff tendonitis which can be treated and hopefully cured

## 2021-01-11 ENCOUNTER — Encounter: Payer: Self-pay | Admitting: Family Medicine

## 2021-01-11 LAB — CBC
HCT: 36.6 % (ref 36.0–46.0)
Hemoglobin: 12.1 g/dL (ref 12.0–15.0)
MCHC: 33 g/dL (ref 30.0–36.0)
MCV: 92.7 fl (ref 78.0–100.0)
Platelets: 240 10*3/uL (ref 150.0–400.0)
RBC: 3.95 Mil/uL (ref 3.87–5.11)
RDW: 13.3 % (ref 11.5–15.5)
WBC: 7.3 10*3/uL (ref 4.0–10.5)

## 2021-01-11 LAB — LIPID PANEL
Cholesterol: 124 mg/dL (ref 0–200)
HDL: 34.2 mg/dL — ABNORMAL LOW (ref >39.00)
LDL Cholesterol: 62 mg/dL (ref 0–99)
NonHDL: 89.64
Total CHOL/HDL Ratio: 4
Triglycerides: 140 mg/dL (ref 0.0–149.0)
VLDL: 28 mg/dL (ref 0.0–40.0)

## 2021-01-11 LAB — COMPREHENSIVE METABOLIC PANEL
ALT: 17 U/L (ref 0–35)
AST: 17 U/L (ref 0–37)
Albumin: 4.3 g/dL (ref 3.5–5.2)
Alkaline Phosphatase: 74 U/L (ref 39–117)
BUN: 9 mg/dL (ref 6–23)
CO2: 27 mEq/L (ref 19–32)
Calcium: 9.3 mg/dL (ref 8.4–10.5)
Chloride: 102 mEq/L (ref 96–112)
Creatinine, Ser: 0.77 mg/dL (ref 0.40–1.20)
GFR: 88.73 mL/min (ref >60.00)
Glucose, Bld: 86 mg/dL (ref 70–99)
Potassium: 3.8 mEq/L (ref 3.5–5.1)
Sodium: 139 mEq/L (ref 135–145)
Total Bilirubin: 0.2 mg/dL (ref 0.2–1.2)
Total Protein: 7.3 g/dL (ref 6.0–8.3)

## 2021-01-11 LAB — HEMOGLOBIN A1C: Hgb A1c MFr Bld: 5.8 % (ref 4.6–6.5)

## 2021-01-11 LAB — VITAMIN D 25 HYDROXY (VIT D DEFICIENCY, FRACTURES): VITD: 45.03 ng/mL (ref 30.00–100.00)

## 2021-01-11 LAB — TSH: TSH: 2.25 u[IU]/mL (ref 0.35–5.50)

## 2021-01-12 LAB — HEPATITIS C ANTIBODY
Hepatitis C Ab: NONREACTIVE
SIGNAL TO CUT-OFF: 0.03 (ref <1.00)

## 2021-01-20 ENCOUNTER — Ambulatory Visit: Payer: Self-pay

## 2021-01-20 ENCOUNTER — Encounter: Payer: Self-pay | Admitting: Physician Assistant

## 2021-01-20 ENCOUNTER — Ambulatory Visit: Payer: 59 | Admitting: Physician Assistant

## 2021-01-20 DIAGNOSIS — G8929 Other chronic pain: Secondary | ICD-10-CM | POA: Diagnosis not present

## 2021-01-20 DIAGNOSIS — M25511 Pain in right shoulder: Secondary | ICD-10-CM | POA: Diagnosis not present

## 2021-01-20 MED ORDER — LIDOCAINE HCL 1 % IJ SOLN
3.0000 mL | INTRAMUSCULAR | Status: AC | PRN
Start: 2021-01-20 — End: 2021-01-20
  Administered 2021-01-20: 3 mL

## 2021-01-20 MED ORDER — METHYLPREDNISOLONE ACETATE 40 MG/ML IJ SUSP
40.0000 mg | INTRAMUSCULAR | Status: AC | PRN
  Administered 2021-01-20: 40 mg via INTRA_ARTICULAR

## 2021-01-20 NOTE — Progress Notes (Signed)
Office Visit Note   Patient: Evelyn Murray           Date of Birth: Feb 19, 1971           MRN: [id number] Visit Date: 01/20/2021              Requested by: Pearline Cables, MD [address] [place],  [place] [address] PCP: Pearline Cables, MD   Assessment & Plan: Visit Diagnoses:  1. Chronic right shoulder pain     Plan: She shown Codman: Wall crawls pendulum and forward flexion exercises for her shoulder.  We will send her to formal physical therapy for range of motion, strengthening modalities to the right shoulder.  She will follow-up with [place] in just 2 weeks to see how she is doing overall.  She continues to have weakness and pain in the shoulder recommend MRI to rule out rotator cuff tear.  Questions were encouraged and answered at length today.  Follow-Up Instructions: No follow-ups on file.   Orders:  Orders Placed This Encounter  Procedures   XR Shoulder Right   Ambulatory referral to Physical Therapy   No orders of the defined types were placed in this encounter.     Procedures: Large Joint Inj: R subacromial bursa on 01/20/2021 4:55 PM Indications: pain Details: 22 G 1.5 in needle, superior approach  Arthrogram: No  Medications: 3 mL lidocaine 1 %; 40 mg methylPREDNISolone acetate 40 MG/ML Outcome: tolerated well, no immediate complications Procedure, treatment alternatives, risks and benefits explained, specific risks discussed. Consent was given by the patient. Immediately prior to procedure a time out was called to verify the correct patient, procedure, equipment, support staff and site/side marked as required. Patient was prepped and draped in the usual sterile fashion.      Clinical Data: No additional findings.   Subjective: Chief Complaint  Patient presents with   Right Shoulder - Pain    HPI Evelyn Murray 50 year old female comes in today with right shoulder pain that is been ongoing for the past year.  She states her pain  is worse at night.  Pain started after having both the flu and COVID shot at the same time in the right shoulder.  She notes that she had some lymph node swelling.  She was seen by her primary care physician who notes that she does have an upcoming mammogram and ultrasound.  Her exam she noted a possible fatty deposit in the right axillary region.  Patient has known cervical degenerative disc disease.  She denies any numbness tingling down the arm.  She states she feels like the right arm is heavy and she has difficulty lifting heavy objects.  She has tried muscle relaxers Advil.  She is had no physical therapy injections.  No prior surgery on the right shoulder.  Review of Systems  Constitutional:  Negative for chills and fever.    Objective: Vital Signs: There were no vitals taken for this visit.  Physical Exam Constitutional:      Appearance: She is not ill-appearing or diaphoretic.  Pulmonary:     Effort: Pulmonary effort is normal.  Neurological:     Mental Status: She is alert and oriented to person, place, and time.  Psychiatric:        Mood and Affect: Mood normal.    Ortho Exam Bilateral shoulders she has negative impingement sign bilaterally.  No significant crepitus with passive range of motion bilateral shoulders.  Slight weakness with external  rotation of the right shoulder against resistance internal rotation of 5 out of 5 bilaterally.  Empty can test is negative bilaterally.  4 out of 5 strength with liftoff exam on the right 5 out of 5 on the left.  Abduction bilateral arms across chest causes no discomfort.  She has no tenderness over the [facility] joints bilaterally.  Bicep strength 5 out of 5 bilaterally. Specialty Comments:  No specialty comments available.  Imaging: XR Shoulder Right  Result Date: 01/20/2021 3 views right shoulder: No acute findings. Shoulder well located. Glenohumeral joint well maintained.  Minimal AC joint arthritic changes.  Subacromial space  well-maintained.     PMFS History: Patient Active Problem List   Diagnosis Date Noted   Posterior vitreous detachment 09/24/2019   Migraine 12/05/2013   Insomnia secondary to anxiety 11/07/2013   Hip flexor tendinitis 07/28/2013   Cervical disc disorder with radiculopathy of cervical region 07/28/2013   Dysplasia of cervix, low grade (CIN 1) 09/06/2011   Asthma with allergic rhinitis 08/20/2011   Pinched nerve 02/16/2011   Low back pain 02/16/2011   Insomnia 02/16/2011   Past Medical History:  Diagnosis Date   Abnormal Pap smear 10/1998   Allergy    ASCUS (atypical squamous cells of undetermined significance) on Pap smear 2006   Chicken pox    Chlamydia infection 02/1998   Treated with  oxacillin   CIN I (cervical intraepithelial neoplasia I) 2011   HPV in female 2001   Hx of migraines    Insomnia 2007   LGSIL (low grade squamous intraepithelial dysplasia)    Migraines    UTI (lower urinary tract infection)     Family History  Problem Relation Age of Onset   Hypertension Mother    Hypertension Father    Hypertension Maternal Grandfather     Past Surgical History:  Procedure Laterality Date   CESAREAN SECTION     Social History   Occupational History   Not on file  Tobacco Use   Smoking status: Never   Smokeless tobacco: Never  Substance and Sexual Activity   Alcohol use: No   Drug use: No   Sexual activity: Not on file

## 2021-01-31 ENCOUNTER — Ambulatory Visit
Admission: RE | Admit: 2021-01-31 | Discharge: 2021-01-31 | Disposition: A | Discharge status: Home / Self Care | Payer: 59 | Source: Ambulatory Visit | Attending: Obstetrics and Gynecology | Admitting: Obstetrics and Gynecology

## 2021-01-31 ENCOUNTER — Other Ambulatory Visit: Payer: Self-pay | Admitting: Obstetrics and Gynecology

## 2021-01-31 DIAGNOSIS — R928 Other abnormal and inconclusive findings on diagnostic imaging of breast: Secondary | ICD-10-CM

## 2021-02-08 ENCOUNTER — Ambulatory Visit: Payer: 59 | Admitting: Physician Assistant

## 2021-03-01 ENCOUNTER — Ambulatory Visit: Payer: 59 | Admitting: Family

## 2021-03-01 ENCOUNTER — Encounter: Payer: Self-pay | Admitting: Family

## 2021-03-01 VITALS — BP 112/76 | HR 99 | Temp 98.3°F | Ht 70.0 in | Wt 213.4 lb

## 2021-03-01 DIAGNOSIS — J019 Acute sinusitis, unspecified: Secondary | ICD-10-CM | POA: Diagnosis not present

## 2021-03-01 MED ORDER — AMOXICILLIN-POT CLAVULANATE 875-125 MG PO TABS: 1.0000 | ORAL_TABLET | Freq: Two times a day (BID) | ORAL | 0 refills | Status: DC

## 2021-03-01 NOTE — Progress Notes (Signed)
Evelyn Murray is a 51 y.o. female with the following history as recorded in EpicCare:  Patient Active Problem List   Diagnosis Date Noted   Posterior vitreous detachment 09/24/2019   Migraine 12/05/2013   Insomnia secondary to anxiety 11/07/2013   Hip flexor tendinitis 07/28/2013   Cervical disc disorder with radiculopathy of cervical region 07/28/2013   Dysplasia of cervix, low grade (CIN 1) 09/06/2011   Asthma with allergic rhinitis 08/20/2011   Pinched nerve 02/16/2011   Low back pain 02/16/2011   Insomnia 02/16/2011    Current Outpatient Medications  Medication Sig Dispense Refill   amitriptyline (ELAVIL) 25 MG tablet TAKE 1 TABLET(25 MG) BY MOUTH AT BEDTIME 90 tablet 3   amoxicillin-clavulanate (AUGMENTIN) 875-125 MG tablet Take 1 tablet by mouth 2 (two) times daily for 10 days. 20 tablet 0   cyclobenzaprine (FLEXERIL) 10 MG tablet Take 1 tablet (10 mg total) by mouth 3 (three) times daily as needed. for muscle spams 90 tablet 1   estradiol (CLIMARA - DOSED IN MG/24 HR) 0.0375 mg/24hr patch Place 0.0375 mg onto the skin once a week.     omeprazole (PRILOSEC) 40 MG capsule Take 40 mg by mouth daily.     progesterone (PROMETRIUM) 100 MG capsule progesterone micronized 100 mg capsule  TAKE 1 CAPSULE BY MOUTH EVERY DAY AT BEDTIME FOR 12 DAYS     SUMAtriptan (IMITREX) 50 MG tablet TAKE 1 TABLET BY MOUTH 1 TIME AS NEEDED FOR MIGRAINE. MAY REPEAT IN 2 HOURS IF HEADACHE PERSISTS OR RECURS 10 tablet 3   No current facility-administered medications for this visit.    Allergies: Patient has no known allergies.  Past Medical History:  Diagnosis Date   Abnormal Pap smear 10/1998   Allergy    ASCUS (atypical squamous cells of undetermined significance) on Pap smear 2006   Chicken pox    Chlamydia infection 02/1998   Treated with  oxacillin   CIN I (cervical intraepithelial neoplasia I) 2011   HPV in female 2001   Hx of migraines    Insomnia 2007   LGSIL (low grade squamous  intraepithelial dysplasia)    Migraines    UTI (lower urinary tract infection)     Past Surgical History:  Procedure Laterality Date   CESAREAN SECTION      Family History  Problem Relation Age of Onset   Hypertension Mother    Hypertension Father    Hypertension Maternal Grandfather     Social History   Tobacco Use   Smoking status: Never   Smokeless tobacco: Never  Substance Use Topics   Alcohol use: No    Subjective:   Sinus infection x 10 days; + sinus pain/ pressure; feels pressure behind right eye; using Dayquil, Nyquil and Mucinex; no chest pain or shortness of breath; prone to sinus infections once per year;     Objective:  Vitals:   03/01/21 1302  BP: 112/76  Pulse: 99  Temp: 98.3 F (36.8 C)  TempSrc: Oral  SpO2: 99%  Weight: 213 lb 6.4 oz (96.8 kg)  Height: 5\' 10"  (1.778 m)    General: Well developed, well nourished, in no acute distress  Skin : Warm and dry.  Head: Normocephalic and atraumatic  Eyes: Sclera and conjunctiva clear; pupils round and reactive to light; extraocular movements intact  Ears: External normal; canals clear; tympanic membranes normal; congested bilaterally Oropharynx: Pink, supple. No suspicious lesions  Neck: Supple without thyromegaly, adenopathy  Lungs: Respirations unlabored; clear to auscultation bilaterally without  wheeze, rales, rhonchi  CVS exam: normal rate and regular rhythm.  Neurologic: Alert and oriented; speech intact; face symmetrical; moves all extremities well; CNII-XII intact without focal deficit   Assessment:  1. Acute sinusitis, recurrence not specified, unspecified location     Plan:   Rx for Augmentin 875 mg bid x 10 days; continue OTC medications; increase fluids, rest and follow up worse, no better.   This visit occurred during the SARS-CoV-2 public health emergency.  Safety protocols were in place, including screening questions prior to the visit, additional usage of staff PPE, and extensive  cleaning of exam room while observing appropriate contact time as indicated for disinfecting solutions.    No follow-ups on file.  No orders of the defined types were placed in this encounter.   Requested Prescriptions   Signed Prescriptions Disp Refills   amoxicillin-clavulanate (AUGMENTIN) 875-125 MG tablet 20 tablet 0    Sig: Take 1 tablet by mouth 2 (two) times daily for 10 days.

## 2021-03-04 ENCOUNTER — Other Ambulatory Visit: Payer: Self-pay | Admitting: Family

## 2021-03-04 ENCOUNTER — Encounter: Payer: Self-pay | Admitting: Family

## 2021-03-04 MED ORDER — DOXYCYCLINE HYCLATE 100 MG PO TABS: 100.0000 mg | ORAL_TABLET | Freq: Two times a day (BID) | ORAL | 0 refills | Status: AC

## 2021-03-04 MED ORDER — PREDNISONE 20 MG PO TABS: ORAL_TABLET | ORAL | 0 refills | Status: AC

## 2021-03-15 ENCOUNTER — Ambulatory Visit: Payer: 59 | Admitting: Physical Therapy

## 2021-08-02 ENCOUNTER — Ambulatory Visit
Admission: RE | Admit: 2021-08-02 | Discharge: 2021-08-02 | Disposition: A | Discharge status: Home / Self Care | Payer: 59 | Source: Ambulatory Visit | Attending: Obstetrics and Gynecology | Admitting: Obstetrics and Gynecology

## 2021-08-02 ENCOUNTER — Ambulatory Visit: Payer: 59

## 2021-08-02 DIAGNOSIS — R928 Other abnormal and inconclusive findings on diagnostic imaging of breast: Secondary | ICD-10-CM

## 2021-11-02 ENCOUNTER — Other Ambulatory Visit: Payer: Self-pay | Admitting: Family

## 2023-07-03 ENCOUNTER — Other Ambulatory Visit: Payer: Self-pay | Admitting: Obstetrics and Gynecology

## 2023-07-03 DIAGNOSIS — Z1231 Encounter for screening mammogram for malignant neoplasm of breast: Secondary | ICD-10-CM

## 2023-08-30 ENCOUNTER — Ambulatory Visit
Admission: RE | Admit: 2023-08-30 | Discharge: 2023-08-30 | Disposition: A | Discharge status: Home / Self Care | Source: Ambulatory Visit | Attending: Obstetrics and Gynecology | Admitting: Obstetrics and Gynecology

## 2023-08-30 DIAGNOSIS — Z1231 Encounter for screening mammogram for malignant neoplasm of breast: Secondary | ICD-10-CM
